# Patient Record
Sex: Male | Born: 1975 | Race: White | Hispanic: No | Marital: Single | Smoking: Never smoker
Health system: Southern US, Community
[De-identification: ages and names within clinical notes are randomized; demographics above are authoritative.]

## PROBLEM LIST (undated history)

## (undated) DIAGNOSIS — Q059 Spina bifida, unspecified: Secondary | ICD-10-CM

## (undated) DIAGNOSIS — K219 Gastro-esophageal reflux disease without esophagitis: Secondary | ICD-10-CM

## (undated) DIAGNOSIS — R569 Unspecified convulsions: Secondary | ICD-10-CM

## (undated) HISTORY — PX: SPINE SURGERY: SHX786

## (undated) HISTORY — PX: SHUNT REVISION: SHX343

## (undated) HISTORY — DX: Gastro-esophageal reflux disease without esophagitis: K21.9

## (undated) HISTORY — PX: FRACTURE SURGERY: SHX138

## (undated) HISTORY — PX: SHUNT REPLACEMENT: SHX5403

---

## 2013-06-19 DIAGNOSIS — G40909 Epilepsy, unspecified, not intractable, without status epilepticus: Secondary | ICD-10-CM | POA: Insufficient documentation

## 2013-08-04 DIAGNOSIS — N319 Neuromuscular dysfunction of bladder, unspecified: Secondary | ICD-10-CM | POA: Insufficient documentation

## 2013-08-04 DIAGNOSIS — Q059 Spina bifida, unspecified: Secondary | ICD-10-CM | POA: Insufficient documentation

## 2020-11-27 ENCOUNTER — Emergency Department (HOSPITAL_COMMUNITY): Payer: Medicare Other

## 2020-11-27 ENCOUNTER — Emergency Department (HOSPITAL_COMMUNITY)
Admission: EM | Admit: 2020-11-27 | Discharge: 2020-11-27 | Disposition: A | Discharge status: Home / Self Care | Payer: Medicare Other | Attending: Emergency Medicine | Admitting: Emergency Medicine

## 2020-11-27 ENCOUNTER — Other Ambulatory Visit: Payer: Self-pay

## 2020-11-27 ENCOUNTER — Encounter (HOSPITAL_COMMUNITY): Payer: Self-pay | Admitting: *Deleted

## 2020-11-27 DIAGNOSIS — K319 Disease of stomach and duodenum, unspecified: Secondary | ICD-10-CM | POA: Insufficient documentation

## 2020-11-27 DIAGNOSIS — G9009 Other idiopathic peripheral autonomic neuropathy: Secondary | ICD-10-CM | POA: Diagnosis present

## 2020-11-27 DIAGNOSIS — Z91040 Latex allergy status: Secondary | ICD-10-CM | POA: Insufficient documentation

## 2020-11-27 DIAGNOSIS — R000 Tachycardia, unspecified: Secondary | ICD-10-CM | POA: Insufficient documentation

## 2020-11-27 DIAGNOSIS — K3189 Other diseases of stomach and duodenum: Secondary | ICD-10-CM

## 2020-11-27 DIAGNOSIS — G609 Hereditary and idiopathic neuropathy, unspecified: Secondary | ICD-10-CM

## 2020-11-27 HISTORY — DX: Spina bifida, unspecified: Q05.9

## 2020-11-27 LAB — CBC WITH DIFFERENTIAL/PLATELET
Abs Immature Granulocytes: 0.02 10*3/uL (ref 0.00–0.07)
Basophils Absolute: 0 10*3/uL (ref 0.0–0.1)
Basophils Relative: 0 %
Eosinophils Absolute: 0.4 10*3/uL (ref 0.0–0.5)
Eosinophils Relative: 6 %
HCT: 44.6 % (ref 39.0–52.0)
Hemoglobin: 15 g/dL (ref 13.0–17.0)
Immature Granulocytes: 0 %
Lymphocytes Relative: 20 %
Lymphs Abs: 1.5 10*3/uL (ref 0.7–4.0)
MCH: 30.8 pg (ref 26.0–34.0)
MCHC: 33.6 g/dL (ref 30.0–36.0)
MCV: 91.6 fL (ref 80.0–100.0)
Monocytes Absolute: 0.5 10*3/uL (ref 0.1–1.0)
Monocytes Relative: 6 %
Neutro Abs: 4.9 10*3/uL (ref 1.7–7.7)
Neutrophils Relative %: 68 %
Platelets: 258 10*3/uL (ref 150–400)
RBC: 4.87 MIL/uL (ref 4.22–5.81)
RDW: 12.8 % (ref 11.5–15.5)
WBC: 7.4 10*3/uL (ref 4.0–10.5)
nRBC: 0 % (ref 0.0–0.2)

## 2020-11-27 LAB — BASIC METABOLIC PANEL
Anion gap: 8 (ref 5–15)
BUN: 13 mg/dL (ref 6–20)
CO2: 28 mmol/L (ref 22–32)
Calcium: 9.3 mg/dL (ref 8.9–10.3)
Chloride: 102 mmol/L (ref 98–111)
Creatinine, Ser: 0.78 mg/dL (ref 0.61–1.24)
GFR, Estimated: >60 mL/min (ref >60)
Glucose, Bld: 104 mg/dL — ABNORMAL HIGH (ref 70–99)
Potassium: 3.4 mmol/L — ABNORMAL LOW (ref 3.5–5.1)
Sodium: 138 mmol/L (ref 135–145)

## 2020-11-27 MED ORDER — IOHEXOL 350 MG/ML SOLN
80.0000 mL | Freq: Once | INTRAVENOUS | Status: AC | PRN
  Administered 2020-11-27: 80 mL via INTRAVENOUS

## 2020-11-27 NOTE — ED Provider Notes (Addendum)
[facility] EMERGENCY DEPARTMENT Provider Note   CSN: [id number] Arrival date & time: 11/27/20  1711     History Chief Complaint  Patient presents with  . Circulatory Problem    Darryl Moran is a 45 y.o. male with a history significant for spina bifida, requiring leg braces and crutch use, although pt and father concur he is predominantly wheelchair bound, presenting for evaluation of a cool left foot with color changes in several toes of this foot.  He also woke this am with an intact blister on the great to of his right foot.  He woke with both of these symptoms today, was seen by his pcp and sent here for concern of difficulty obtaining pulses in the left foot.  Pt reports having sensation at his left arch and left dorsal lateral foot at baseline but chronically otherwise has no sensation in his feet, therefore denies any pain currently.  Denies any injuries to the feet including hot water injury. No tx prior to arrival.  No history of vascular disease, HTN or cholesterol issues. He does not smoke.    The history is provided by the patient and a parent (father helps with history, at bedside).       Past Medical History:  Diagnosis Date  . GERD (gastroesophageal reflux disease)   . GERD (gastroesophageal reflux disease)   . Spina bifida ([place])            No family history on file.  Social History   Tobacco Use  . Smoking status: Never Smoker  . Smokeless tobacco: Never Used  Substance Use Topics  . Alcohol use: Not Currently  . Drug use: Not Currently    Home Medications Prior to Admission medications   Medication Sig Start Date End Date Taking? Authorizing Provider  Ascorbic Acid (VITAMIN C CR) 500 MG TBCR Take by mouth. Patient not taking: No sig reported 05/19/11   [provider]  Calcium Carbonate-Vitamin D 600-200 MG-UNIT TABS Take 1 tablet by mouth at bedtime. 05/19/11   [provider]  Cholecalciferol (VITAMIN D3) 250 MCG (10000 UT)  capsule Take 10,000 Units by mouth at bedtime.    [provider]  imipramine (TOFRANIL) 50 MG tablet Take 50 mg by mouth at bedtime. 10/01/20   [provider]  levETIRAcetam (KEPPRA) 500 MG tablet Take 250-500 mg by mouth See admin instructions. Take 20 mg by mouth in the morning and 500 at night 10/10/20   [provider]  magnesium 30 MG tablet Take 30 mg by mouth daily. Patient not taking: Reported on 11/28/2020    [provider]  Multiple Vitamin (DAILY VITAMINS) tablet Take 1 tablet by mouth at bedtime. 04/06/03   [provider]  omeprazole (PRILOSEC) 20 MG capsule Take 20 mg by mouth in the morning. 09/12/20   [provider]  oxybutynin (DITROPAN XL) 15 MG 24 hr tablet Take 15 mg by mouth in the morning. 09/18/20   [provider]  oxybutynin (DITROPAN) 5 MG tablet Take 5 mg by mouth at bedtime. 11/17/20   [provider]  Probiotic Product (PROBIOTIC DAILY PO) Take by mouth daily. Patient not taking: No sig reported    [provider]  Saccharomyces boulardii (PROBIOTIC) 250 MG CAPS Take 250 mg by mouth daily.    [provider]  sertraline (ZOLOFT) 100 MG tablet Take 100 mg by mouth at bedtime. 11/23/20   [provider]  vitamin C (ASCORBIC ACID) 500 MG tablet Take  500 mg by mouth daily.    [provider]  VITAMIN D PO Take 250 mg by mouth daily. Patient not taking: No sig reported    [provider]    Allergies    Latex and Amoxicillin  Review of Systems   Review of Systems  Constitutional: Negative for chills and fever.  HENT: Negative for sore throat.   Eyes: Negative.   Respiratory: Negative for chest tightness and shortness of breath.   Cardiovascular: Negative for chest pain.  Gastrointestinal: Negative for abdominal pain, nausea and vomiting.  Genitourinary: Negative.   Musculoskeletal: Negative for arthralgias, joint swelling and neck pain.  Skin:  Positive for color change. Negative for rash.  Neurological: Positive for numbness. Negative for dizziness, weakness, light-headedness and headaches.       Baseline lower extremity numbness and weakness per hpi.  Psychiatric/Behavioral: Negative.   All other systems reviewed and are negative.   Physical Exam Updated Vital Signs BP (!) 122/92 (BP Location: Right Arm)   Pulse (!) 109   Temp 98.1 F (36.7 C) (Oral)   Resp 17   SpO2 100%   Physical Exam Vitals and nursing note reviewed.  Constitutional:      Appearance: He is well-developed and well-nourished.  HENT:     Head: Normocephalic and atraumatic.  Eyes:     Conjunctiva/sclera: Conjunctivae normal.  Cardiovascular:     Rate and Rhythm: Regular rhythm. Tachycardia present.     Pulses: Intact distal pulses.          Dorsalis pedis pulses are 2+ on the right side and detected w/ Doppler on the left side.       Posterior tibial pulses are 2+ on the right side and detected w/ Doppler on the left side.     Heart sounds: Normal heart sounds.     Comments: 6 sec cap refill left toes, 3 sec right. Left foot cool to touch, right warm.  Pulmonary:     Effort: Pulmonary effort is normal.     Breath sounds: Normal breath sounds. No wheezing.  Abdominal:     General: There is no distension.     Palpations: Abdomen is soft.     Tenderness: There is no abdominal tenderness.  Musculoskeletal:        General: Normal range of motion.     Cervical back: Normal range of motion.     Right lower leg: No edema.     Left lower leg: No edema.  Skin:    General: Skin is warm and dry.     Comments: Intact blister right distal great toe. Dusky 2nd and third distal toes, left.  Coloration near normalizes with elevation. No edema of feet or lower legs.  Muscle atrophy noted bilateral calves and thighs.   Neurological:     Mental Status: He is alert.  Psychiatric:        Mood and Affect: Mood and affect normal.        ED Results /  Procedures / Treatments   Labs (all labs ordered are listed, but only abnormal results are displayed) Labs Reviewed  BASIC METABOLIC PANEL - Abnormal; Notable for the following components:      Result Value   Potassium 3.4 (*)    Glucose, Bld 104 (*)    All other components within normal limits  CBC WITH DIFFERENTIAL/PLATELET    EKG None  Radiology CT Angio Aortobifemoral W and/or Wo Contrast  Result Date: 11/27/2020 CLINICAL DATA:  Claudication.  Purple feet. Lack of circulation. No pulses in the feet. EXAM: CT ANGIOGRAPHY OF ABDOMINAL AORTA WITH ILIOFEMORAL RUNOFF TECHNIQUE: Multidetector CT imaging of the abdomen, pelvis and lower extremities was performed using the standard protocol during bolus administration of intravenous contrast. Multiplanar CT image reconstructions and MIPs were obtained to evaluate the vascular anatomy. CONTRAST:  91mL OMNIPAQUE IOHEXOL 350 MG/ML SOLN COMPARISON:  80 cc of Omnipaque 350. FINDINGS: VASCULAR Aorta: Normal caliber aorta without aneurysm, dissection, vasculitis or significant stenosis. Celiac: Patent without evidence of aneurysm, dissection, vasculitis or significant stenosis. SMA: Patent without evidence of aneurysm, dissection, vasculitis or significant stenosis. Renals: Both renal arteries are patent without evidence of aneurysm, dissection, vasculitis, fibromuscular dysplasia or significant stenosis. IMA: Patent without evidence of aneurysm, dissection, vasculitis or significant stenosis. RIGHT Lower Extremity Inflow: Common, internal and external iliac arteries are patent without evidence of aneurysm, dissection, vasculitis or significant stenosis. Outflow: Common, superficial and profunda femoral arteries and the popliteal artery are patent without evidence of aneurysm, dissection, vasculitis or significant stenosis. Runoff: Patent three vessel runoff to the ankle. LEFT Lower Extremity Inflow: Common, internal and external iliac arteries are patent  without evidence of aneurysm, dissection, vasculitis or significant stenosis. Outflow: Common, superficial and profunda femoral arteries and the popliteal artery are patent without evidence of aneurysm, dissection, vasculitis or significant stenosis. Runoff: Patent three vessel runoff to the ankle. Veins: No obvious venous abnormality within the limitations of this arterial phase study. Review of the MIP images confirms the above findings. NON-VASCULAR Lower chest: The lung bases are clear. The heart size is normal. Hepatobiliary: The liver is normal. Cholelithiasis without acute inflammation.There is no biliary ductal dilation. Pancreas: Normal contours without ductal dilatation. No peripancreatic fluid collection. Spleen: Unremarkable. Adrenals/Urinary Tract: --Adrenal glands: Unremarkable. --Right kidney/ureter: No hydronephrosis or radiopaque kidney stones. --Left kidney/ureter: No hydronephrosis or radiopaque kidney stones. --Urinary bladder: The urinary bladder is distended. There is some mild bladder wall thickening. Stomach/Bowel: --Stomach/Duodenum: There is an exophytic masslike area arising from the greater curvature of the gastric body measuring approximately 2 6 cm axial series 4, image 40). --Small bowel: Unremarkable. --Colon: Unremarkable. --Appendix: Normal. Vascular/Lymphatic: Normal course and caliber of the major abdominal vessels. --No retroperitoneal lymphadenopathy. --No mesenteric lymphadenopathy. --No pelvic or inguinal lymphadenopathy. Reproductive: Unremarkable Other: No ascites or free air. The abdominal wall is normal. Musculoskeletal. Patient is status post prior plate screw fixation of the proximal left femur. There is a VP shunt noted coursing through the right abdominal wall with the tip terminating at the right hepatic dome. There is lower extremity muscular atrophy, especially below the knees. There are shallow appearing acetabula bilaterally, likely IMPRESSION: 1. No acute  arterial abnormality detected. However, evaluation below the knees was somewhat limited by contrast timing. 2. Cholelithiasis without acute inflammation. 3. Distended urinary bladder with some mild bladder wall thickening. Correlate with urinalysis. Findings are suggestive of a neurogenic bladder. 4. Exophytic masslike area arising from the greater curvature of the gastric body. This is concerning for a malignancy such as a GIST. Outpatient GI follow-up is recommended. Electronically Signed   By: Constance Holster M.D.   On: 11/27/2020 20:59    Procedures Procedures   Medications Ordered in ED Medications  iohexol (OMNIPAQUE) 350 MG/ML injection 80 mL (80 mLs Intravenous Contrast Given 11/27/20 2045)    ED Course  I have reviewed the triage vital signs and the nursing notes.  Pertinent labs & imaging results that were available during my care of the patient were reviewed by  me and considered in my medical decision making (see chart for details).    MDM Rules/Calculators/A&P                          Call placed to Dr. Donnetta Hutching with vascular after exam,  Recommends CT imaging with runoff study prior to further discussion.  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  After further discussion with Dr. Donnetta Hutching pt does not need f/u with vascular.    Possibly neurogenic source of sx.  Pt and father advised elevation, may try warm compresses ( cautioned re mild setting and for not more than 15 minutes to avoid skin injury).  Close f/u with pcp for further eval/management.    Also call placed to Dr. Abbey Chatters with finding of possible gastric tumor.  He will see pt in close office f/u, office will call pt in am to arrange.  Question answered and pt and father agree with plan.  Pt does have hx of gerd, possibly old h/o ulcer, sx currently ok tx with PPI. Final Clinical Impression(s) / ED Diagnoses Final diagnoses:  Idiopathic peripheral  neuropathy  Gastric mass    Rx / DC Orders ED Discharge Orders    None       Landis Martins 11/28/20 1331    Evalee Jefferson, PA-C 11/28/20 1331    Milton Ferguson, MD 11/28/20 1459

## 2020-11-27 NOTE — ED Notes (Signed)
Patient transported to CT 

## 2020-11-27 NOTE — ED Triage Notes (Signed)
States he was referred her by family doctor due to lack of circulation in feet, states the doctor could not find a pulse in feet

## 2020-11-27 NOTE — Discharge Instructions (Addendum)
Your CT scan is negative for any significant vascular findings (no blockage in your arteries of your abdomen or legs).  As discussed,  there is a thickened, mass like finding in your stomach of unclear significance but will need further testing with endoscopy as discussed.  Dr. Abbey Chatters will call you tomorrow to arrange an office visit to further evaluate this finding.

## 2020-11-28 ENCOUNTER — Encounter: Payer: Self-pay | Admitting: Internal Medicine

## 2020-11-28 ENCOUNTER — Ambulatory Visit (INDEPENDENT_AMBULATORY_CARE_PROVIDER_SITE_OTHER): Payer: Medicare Other | Admitting: Internal Medicine

## 2020-11-28 DIAGNOSIS — K219 Gastro-esophageal reflux disease without esophagitis: Secondary | ICD-10-CM

## 2020-11-28 DIAGNOSIS — R935 Abnormal findings on diagnostic imaging of other abdominal regions, including retroperitoneum: Secondary | ICD-10-CM | POA: Diagnosis not present

## 2020-11-28 NOTE — Patient Instructions (Signed)
62    Your procedure is scheduled on: 12/04/2020  Report to [facility] at  7:00   AM.  Call this number if you have problems the morning of surgery: [phone]   Remember:   Follow instructions on letter from office regarding when to stop eating and drinking        No Smoking the day of procedure      Take these medicines the morning of surgery with A SIP OF WATER: Keppra, zoloft, and omeprazole   Do not wear jewelry, make-up or nail polish.  Do not wear lotions, powders, or perfumes. You may wear deodorant.                Do not bring valuables to the hospital.  Contacts, dentures or bridgework may not be worn into surgery.  Leave suitcase in the car. After surgery it may be brought to your room.  For patients admitted to the hospital, checkout time is 11:00 AM the day of discharge.   Patients discharged the day of surgery will not be allowed to drive home. Upper Endoscopy, Adult Upper endoscopy is a procedure to look inside the upper GI (gastrointestinal) tract. The upper GI tract is made up of:  The part of the body that moves food from your mouth to your stomach (esophagus).  The stomach.  The first part of your small intestine (duodenum). This procedure is also called esophagogastroduodenoscopy (EGD) or gastroscopy. In this procedure, your health care provider passes a thin, flexible tube (endoscope) through your mouth and down your esophagus into your stomach. A small camera is attached to the end of the tube. Images from the camera appear on a monitor in the exam room. During this procedure, your health care provider may also remove a small piece of tissue to be sent to a lab and examined under a microscope (biopsy). Your health care provider may do an upper endoscopy to diagnose cancers of the upper GI tract. You may also have this procedure to find the cause of other conditions, such as:  Stomach pain.  Heartburn.  Pain or problems when swallowing.  Nausea and  vomiting.  Stomach bleeding.  Stomach ulcers. Tell a health care provider about:  Any allergies you have.  All medicines you are taking, including vitamins, herbs, eye drops, creams, and over-the-counter medicines.  Any problems you or family members have had with anesthetic medicines.  Any blood disorders you have.  Any surgeries you have had.  Any medical conditions you have.  Whether you are pregnant or may be pregnant. What are the risks? Generally, this is a safe procedure. However, problems may occur, including:  Infection.  Bleeding.  Allergic reactions to medicines.  A tear or hole (perforation) in the esophagus, stomach, or duodenum. What happens before the procedure? Staying hydrated Follow instructions from your health care provider about hydration, which may include:  Up to 2 hours before the procedure - you may continue to drink clear liquids, such as water, clear fruit juice, black coffee, and plain tea.  Eating and drinking restrictions Follow instructions from your health care provider about eating and drinking, which may include:  8 hours before the procedure - stop eating heavy meals or foods, such as meat, fried foods, or fatty foods.  6 hours before the procedure - stop eating light meals or foods, such as toast or cereal.  6 hours before the procedure - stop drinking milk or drinks that contain milk.  2 hours before the  procedure - stop drinking clear liquids. Medicines Ask your health care provider about:  Changing or stopping your regular medicines. This is especially important if you are taking diabetes medicines or blood thinners.  Taking medicines such as aspirin and ibuprofen. These medicines can thin your blood. Do not take these medicines unless your health care provider tells you to take them.  Taking over-the-counter medicines, vitamins, herbs, and supplements. General instructions  Plan to have someone take you home from the  hospital or clinic.  If you will be going home right after the procedure, plan to have someone with you for 24 hours.  Ask your health care provider what steps will be taken to help prevent infection. What happens during the procedure?  1. An IV will be inserted into one of your veins. 2. You may be given one or more of the following: ? A medicine to help you relax (sedative). ? A medicine to numb the throat (local anesthetic). 3. You will lie on your left side on an exam table. 4. Your health care provider will pass the endoscope through your mouth and down your esophagus. 5. Your health care provider will use the scope to check the inside of your esophagus, stomach, and duodenum. Biopsies may be taken. 6. The endoscope will be removed. The procedure may vary among health care providers and hospitals. What happens after the procedure?  Your blood pressure, heart rate, breathing rate, and blood oxygen level will be monitored until you leave the hospital or clinic.  Do not drive for 24 hours if you were given a sedative during your procedure.  When your throat is no longer numb, you may be given some fluids to drink.  It is up to you to get the results of your procedure. Ask your health care provider, or the department that is doing the procedure, when your results will be ready. Summary  Upper endoscopy is a procedure to look inside the upper GI tract.  During the procedure, an IV will be inserted into one of your veins. You may be given a medicine to help you relax.  A medicine will be used to numb your throat.  The endoscope will be passed through your mouth and down your esophagus. This information is not intended to replace advice given to you by your health care provider. Make sure you discuss any questions you have with your health care provider. Document Revised: 03/24/2018 Document Reviewed: 03/01/2018 Elsevier Patient Education  [place] After  Please read the instructions outlined below and refer to this sheet in the next few weeks. These discharge instructions provide you with general information on caring for yourself after you leave the hospital. Your doctor may  also give you specific instructions. While your treatment has been planned according to the most current medical practices available, unavoidable complications occasionally occur. If you have any problems or questions after discharge, please call your doctor. HOME CARE INSTRUCTIONS Activity  You may resume your regular activity but move at a slower pace for the next 24 hours.   Take frequent rest periods for the next 24 hours.   Walking will help expel (get rid of) the air and reduce the bloated feeling in your abdomen.   No driving for 24 hours (because of the anesthesia (medicine) used during the test).   You may shower.   Do not sign any important legal documents or operate any machinery for 24 hours (because of the anesthesia used during the test).  Nutrition  Drink plenty of fluids.   You may resume your normal diet.   Begin with a light meal and progress to your normal diet.   Avoid alcoholic beverages for 24 hours or as instructed by your caregiver.  Medications You may resume your normal medications unless your caregiver tells you otherwise. What you can expect today  You may experience abdominal discomfort such as a feeling of fullness or "gas" pains.   You may experience a sore throat for 2 to 3 days. This is normal. Gargling with salt water may help this.  Follow-up Your doctor will discuss the results of your test with you. SEEK IMMEDIATE MEDICAL CARE IF:  You have excessive nausea (feeling sick to your stomach) and/or vomiting.   You have severe abdominal pain and distention (swelling).   You have trouble  swallowing.   You have a temperature over 100 F (37.8 C).   You have rectal bleeding or vomiting of blood.  Document Released: 05/13/2004 Document Revised: 09/18/2011 Document Reviewed: 11/24/2007

## 2020-11-28 NOTE — Patient Instructions (Signed)
We will schedule you for urgent upper endoscopy to further evaluate abnormal CT findings.  Depending on what I find will determine the next steps.  I will likely perform biopsies as well.  Continue on PPI for chronic reflux which appears to be well controlled.  Further recommendations to follow.  At [facility] we value your feedback. You may receive a survey about your visit today. Please share your experience as we strive to create trusting relationships with our patients to provide genuine, compassionate, quality care.  We appreciate your understanding and patience as we review any laboratory studies, imaging, and other diagnostic tests that are ordered as we care for you. Our office policy is 5 business days for review of these results, and any emergent or urgent results are addressed in a timely manner for your best interest. If you do not hear from our office in 1 week, please contact us.   We also encourage the use of MyChart, which contains your medical information for your review as well. If you are not enrolled in this feature, an access code is on this after visit summary for your convenience. Thank you for allowing [place] to be involved in your care.  It was great to see you today!  I hope you have a great rest of your winter!!    Elon Alas. Abbey Chatters, D.O. Gastroenterology and Hepatology [facility]

## 2020-11-28 NOTE — H&P (View-Only) (Signed)
Primary Care Physician:  Ludwig Clarks, FNP Primary Gastroenterologist:  Dr. Abbey Chatters  Chief Complaint  Patient presents with  . abnormal ct    HPI:   Darryl Moran is a 45 y.o. male who presents to the clinic today by referral from emergency room for evaluation.  He has a history of spina bifida requiring leg braces and crutch use though is primarily wheelchair-bound.  Patient presented to [facility] ER yesterday for concern for vascular issues with color changes and several toes of his left foot.  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  I was called by the emergency provider regarding this and decided to set him up for urgent outpatient follow-up.  Patient does have chronic reflux which is well controlled on omeprazole.  Denies any dysphagia odynophagia.  Parents note that he has had GI issues his whole life but are well controlled on bowel regimen.  Patient states he has had some intermittent left upper quadrant pain otherwise no other complaints.  No weight loss.  No family history of colorectal malignancy but does note his grandmother had a's "small bowel cancer."  Patient's father is unclear what type of cancer this was.  Past Medical History:  Diagnosis Date  . GERD (gastroesophageal reflux disease)   . GERD (gastroesophageal reflux disease)   . Spina bifida [facility])     Past Surgical History:  Procedure Laterality Date  . SPINE SURGERY      Current Outpatient Medications  Medication Sig Dispense Refill  . Ascorbic Acid (VITAMIN C CR) 500 MG TBCR Take by mouth. (Patient not taking: No sig reported)    . Calcium Carbonate-Vitamin D 600-200 MG-UNIT TABS Take 1 tablet by mouth at bedtime.    Marland Kitchen imipramine (TOFRANIL) 50 MG tablet Take 50 mg by mouth at bedtime.    . levETIRAcetam (KEPPRA) 500 MG tablet Take 250-500 mg by mouth See admin instructions. Take 20 mg by mouth in the morning and 500 at  night    . magnesium 30 MG tablet Take 30 mg by mouth daily. (Patient not taking: Reported on 11/28/2020)    . Multiple Vitamin (DAILY VITAMINS) tablet Take 1 tablet by mouth at bedtime.    Marland Kitchen omeprazole (PRILOSEC) 20 MG capsule Take 20 mg by mouth in the morning.    Marland Kitchen oxybutynin (DITROPAN XL) 15 MG 24 hr tablet Take 15 mg by mouth in the morning.    . Probiotic Product (PROBIOTIC DAILY PO) Take by mouth daily. (Patient not taking: No sig reported)    . sertraline (ZOLOFT) 100 MG tablet Take 100 mg by mouth at bedtime.    Marland Kitchen VITAMIN D PO Take 250 mg by mouth daily. (Patient not taking: No sig reported)    . Cholecalciferol (VITAMIN D3) 250 MCG (10000 UT) capsule Take 10,000 Units by mouth at bedtime.    Marland Kitchen oxybutynin (DITROPAN) 5 MG tablet Take 5 mg by mouth at bedtime.    . Saccharomyces boulardii (PROBIOTIC) 250 MG CAPS Take 250 mg by mouth daily.    . vitamin C (ASCORBIC ACID) 500 MG tablet Take 500 mg by mouth daily.     No current facility-administered medications for this visit.    Allergies as of 11/28/2020 - Review Complete 11/28/2020  Allergen Reaction Noted  . Latex Anaphylaxis 11/28/2020  . Amoxicillin Rash 11/28/2020    History reviewed. No pertinent family history.  Social History   Socioeconomic  History  . Marital status: Single    Spouse name: Not on file  . Number of children: Not on file  . Years of education: Not on file  . Highest education level: Not on file  Occupational History  . Not on file  Tobacco Use  . Smoking status: Never Smoker  . Smokeless tobacco: Never Used  Substance and Sexual Activity  . Alcohol use: Not Currently  . Drug use: Not Currently  . Sexual activity: Not on file  Other Topics Concern  . Not on file  Social History Narrative  . Not on file   Social Determinants of Health   Financial Resource Strain: Not on file  Food Insecurity: Not on file  Transportation Needs: Not on file  Physical Activity: Not on file  Stress: Not on  file  Social Connections: Not on file  Intimate Partner Violence: Not on file    Subjective: Review of Systems  Constitutional: Negative for chills and fever.  HENT: Negative for congestion and hearing loss.   Eyes: Negative for blurred vision and double vision.  Respiratory: Negative for cough and shortness of breath.   Cardiovascular: Negative for chest pain and palpitations.  Gastrointestinal: Negative for abdominal pain, blood in stool, constipation, diarrhea, heartburn, melena and vomiting.  Genitourinary: Negative for dysuria and urgency.  Musculoskeletal: Negative for joint pain and myalgias.  Skin: Negative for itching and rash.  Neurological: Negative for dizziness and headaches.  Psychiatric/Behavioral: Negative for depression. The patient is not nervous/anxious.        Objective: BP 127/84   Pulse (!) 117   Temp (!) 97.5 F (36.4 C) (Temporal)   Ht 4\' 11"  (1.499 m)   Wt 136 lb (61.7 kg)   BMI 27.47 kg/m  Physical Exam Constitutional:      Appearance: Normal appearance.     Comments: Uses crutches bilaterally  HENT:     Head: Normocephalic and atraumatic.  Eyes:     Extraocular Movements: Extraocular movements intact.     Conjunctiva/sclera: Conjunctivae normal.  Cardiovascular:     Rate and Rhythm: Normal rate and regular rhythm.  Pulmonary:     Effort: Pulmonary effort is normal.     Breath sounds: Normal breath sounds.  Abdominal:     General: Bowel sounds are normal.     Palpations: Abdomen is soft.  Musculoskeletal:        General: Normal range of motion.     Cervical back: Normal range of motion and neck supple.  Skin:    General: Skin is warm.  Neurological:     General: No focal deficit present.     Mental Status: He is alert and oriented to person, place, and time.  Psychiatric:        Mood and Affect: Mood normal.        Behavior: Behavior normal.      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  I showed them CT imaging as well.  We will schedule for urgent upper endoscopy to further evaluate.  I will likely take biopsies of this area as well.  The risks including infection, bleed, or perforation as well as benefits, limitations, alternatives and imponderables have been reviewed with the patient. Potential for esophageal dilation, biopsy, etc. have also been reviewed.  Questions have been answered. All parties agreeable.  Patient may need EUS in the future but I think a  diagnostic EGD is a reasonable place to start.  Reflux well controlled on omeprazole.  We will continue.   11/28/2020 1:37 PM   Disclaimer: This note was dictated with voice recognition software. Similar sounding words can inadvertently be transcribed and may not be corrected upon review.

## 2020-11-28 NOTE — Progress Notes (Signed)
Primary Care Physician:  Ludwig Clarks, FNP Primary Gastroenterologist:  Dr. Abbey Chatters  Chief Complaint  Patient presents with  . abnormal ct    HPI:   Darryl Moran is a 45 y.o. male who presents to the clinic today by referral from emergency room for evaluation.  He has a history of spina bifida requiring leg braces and crutch use though is primarily wheelchair-bound.  Patient presented to [facility] ER yesterday for concern for vascular issues with color changes and several toes of his left foot.  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  I was called by the emergency provider regarding this and decided to set him up for urgent outpatient follow-up.  Patient does have chronic reflux which is well controlled on omeprazole.  Denies any dysphagia odynophagia.  Parents note that he has had GI issues his whole life but are well controlled on bowel regimen.  Patient states he has had some intermittent left upper quadrant pain otherwise no other complaints.  No weight loss.  No family history of colorectal malignancy but does note his grandmother had a's "small bowel cancer."  Patient's father is unclear what type of cancer this was.  Past Medical History:  Diagnosis Date  . GERD (gastroesophageal reflux disease)   . GERD (gastroesophageal reflux disease)   . Spina bifida [facility])     Past Surgical History:  Procedure Laterality Date  . SPINE SURGERY      Current Outpatient Medications  Medication Sig Dispense Refill  . Ascorbic Acid (VITAMIN C CR) 500 MG TBCR Take by mouth. (Patient not taking: No sig reported)    . Calcium Carbonate-Vitamin D 600-200 MG-UNIT TABS Take 1 tablet by mouth at bedtime.    Marland Kitchen imipramine (TOFRANIL) 50 MG tablet Take 50 mg by mouth at bedtime.    . levETIRAcetam (KEPPRA) 500 MG tablet Take 250-500 mg by mouth See admin instructions. Take 20 mg by mouth in the morning and 500 at  night    . magnesium 30 MG tablet Take 30 mg by mouth daily. (Patient not taking: Reported on 11/28/2020)    . Multiple Vitamin (DAILY VITAMINS) tablet Take 1 tablet by mouth at bedtime.    Marland Kitchen omeprazole (PRILOSEC) 20 MG capsule Take 20 mg by mouth in the morning.    Marland Kitchen oxybutynin (DITROPAN XL) 15 MG 24 hr tablet Take 15 mg by mouth in the morning.    . Probiotic Product (PROBIOTIC DAILY PO) Take by mouth daily. (Patient not taking: No sig reported)    . sertraline (ZOLOFT) 100 MG tablet Take 100 mg by mouth at bedtime.    Marland Kitchen VITAMIN D PO Take 250 mg by mouth daily. (Patient not taking: No sig reported)    . Cholecalciferol (VITAMIN D3) 250 MCG (10000 UT) capsule Take 10,000 Units by mouth at bedtime.    Marland Kitchen oxybutynin (DITROPAN) 5 MG tablet Take 5 mg by mouth at bedtime.    . Saccharomyces boulardii (PROBIOTIC) 250 MG CAPS Take 250 mg by mouth daily.    . vitamin C (ASCORBIC ACID) 500 MG tablet Take 500 mg by mouth daily.     No current facility-administered medications for this visit.    Allergies as of 11/28/2020 - Review Complete 11/28/2020  Allergen Reaction Noted  . Latex Anaphylaxis 11/28/2020  . Amoxicillin Rash 11/28/2020    History reviewed. No pertinent family history.  Social History   Socioeconomic  History  . Marital status: Single    Spouse name: Not on file  . Number of children: Not on file  . Years of education: Not on file  . Highest education level: Not on file  Occupational History  . Not on file  Tobacco Use  . Smoking status: Never Smoker  . Smokeless tobacco: Never Used  Substance and Sexual Activity  . Alcohol use: Not Currently  . Drug use: Not Currently  . Sexual activity: Not on file  Other Topics Concern  . Not on file  Social History Narrative  . Not on file   Social Determinants of Health   Financial Resource Strain: Not on file  Food Insecurity: Not on file  Transportation Needs: Not on file  Physical Activity: Not on file  Stress: Not on  file  Social Connections: Not on file  Intimate Partner Violence: Not on file    Subjective: Review of Systems  Constitutional: Negative for chills and fever.  HENT: Negative for congestion and hearing loss.   Eyes: Negative for blurred vision and double vision.  Respiratory: Negative for cough and shortness of breath.   Cardiovascular: Negative for chest pain and palpitations.  Gastrointestinal: Negative for abdominal pain, blood in stool, constipation, diarrhea, heartburn, melena and vomiting.  Genitourinary: Negative for dysuria and urgency.  Musculoskeletal: Negative for joint pain and myalgias.  Skin: Negative for itching and rash.  Neurological: Negative for dizziness and headaches.  Psychiatric/Behavioral: Negative for depression. The patient is not nervous/anxious.        Objective: BP 127/84   Pulse (!) 117   Temp (!) 97.5 F (36.4 C) (Temporal)   Ht 4\' 11"  (1.499 m)   Wt 136 lb (61.7 kg)   BMI 27.47 kg/m  Physical Exam Constitutional:      Appearance: Normal appearance.     Comments: Uses crutches bilaterally  HENT:     Head: Normocephalic and atraumatic.  Eyes:     Extraocular Movements: Extraocular movements intact.     Conjunctiva/sclera: Conjunctivae normal.  Cardiovascular:     Rate and Rhythm: Normal rate and regular rhythm.  Pulmonary:     Effort: Pulmonary effort is normal.     Breath sounds: Normal breath sounds.  Abdominal:     General: Bowel sounds are normal.     Palpations: Abdomen is soft.  Musculoskeletal:        General: Normal range of motion.     Cervical back: Normal range of motion and neck supple.  Skin:    General: Skin is warm.  Neurological:     General: No focal deficit present.     Mental Status: He is alert and oriented to person, place, and time.  Psychiatric:        Mood and Affect: Mood normal.        Behavior: Behavior normal.      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  I showed them CT imaging as well.  We will schedule for urgent upper endoscopy to further evaluate.  I will likely take biopsies of this area as well.  The risks including infection, bleed, or perforation as well as benefits, limitations, alternatives and imponderables have been reviewed with the patient. Potential for esophageal dilation, biopsy, etc. have also been reviewed.  Questions have been answered. All parties agreeable.  Patient may need EUS in the future but I think a  diagnostic EGD is a reasonable place to start.  Reflux well controlled on omeprazole.  We will continue.   11/28/2020 1:37 PM   Disclaimer: This note was dictated with voice recognition software. Similar sounding words can inadvertently be transcribed and may not be corrected upon review.

## 2020-11-29 ENCOUNTER — Telehealth: Payer: Self-pay | Admitting: *Deleted

## 2020-11-29 NOTE — Telephone Encounter (Signed)
-----   Message from [name], DO sent at 11/27/2020  9:58 PM EST ----- The ER just called me on this patient. He needs urgent evaluation in our clinic for abnormal CT findings. Can we call and set him up for office visit with either myself or one of the apps? Thank you

## 2020-11-30 ENCOUNTER — Encounter (HOSPITAL_COMMUNITY)
Admission: RE | Admit: 2020-11-30 | Discharge: 2020-11-30 | Disposition: A | Discharge status: Home / Self Care | Payer: Medicare Other | Source: Ambulatory Visit | Attending: Internal Medicine | Admitting: Internal Medicine

## 2020-11-30 ENCOUNTER — Other Ambulatory Visit: Payer: Self-pay

## 2020-11-30 ENCOUNTER — Other Ambulatory Visit (HOSPITAL_COMMUNITY)
Admission: RE | Admit: 2020-11-30 | Discharge: 2020-11-30 | Disposition: A | Discharge status: Home / Self Care | Payer: Medicare Other | Source: Ambulatory Visit | Attending: Internal Medicine | Admitting: Internal Medicine

## 2020-11-30 ENCOUNTER — Encounter (HOSPITAL_COMMUNITY): Payer: Self-pay

## 2020-11-30 DIAGNOSIS — Z20822 Contact with and (suspected) exposure to covid-19: Secondary | ICD-10-CM | POA: Insufficient documentation

## 2020-11-30 DIAGNOSIS — Z01812 Encounter for preprocedural laboratory examination: Secondary | ICD-10-CM | POA: Diagnosis not present

## 2020-11-30 HISTORY — DX: Unspecified convulsions: R56.9

## 2020-12-01 LAB — SARS CORONAVIRUS 2 (TAT 6-24 HRS): SARS Coronavirus 2: NEGATIVE

## 2020-12-04 ENCOUNTER — Encounter (HOSPITAL_COMMUNITY): Payer: Self-pay

## 2020-12-04 ENCOUNTER — Ambulatory Visit (HOSPITAL_COMMUNITY)
Admission: RE | Admit: 2020-12-04 | Discharge: 2020-12-04 | Disposition: A | Discharge status: Home / Self Care | Payer: Medicare Other | Attending: Internal Medicine | Admitting: Internal Medicine

## 2020-12-04 ENCOUNTER — Encounter (HOSPITAL_COMMUNITY)
Admission: RE | Disposition: A | Discharge status: Home / Self Care | Payer: Self-pay | Source: Home / Self Care | Attending: Internal Medicine

## 2020-12-04 ENCOUNTER — Ambulatory Visit (HOSPITAL_COMMUNITY): Payer: Medicare Other | Admitting: Anesthesiology

## 2020-12-04 DIAGNOSIS — Z79899 Other long term (current) drug therapy: Secondary | ICD-10-CM | POA: Insufficient documentation

## 2020-12-04 DIAGNOSIS — D490 Neoplasm of unspecified behavior of digestive system: Secondary | ICD-10-CM | POA: Diagnosis not present

## 2020-12-04 DIAGNOSIS — Z880 Allergy status to penicillin: Secondary | ICD-10-CM | POA: Diagnosis not present

## 2020-12-04 DIAGNOSIS — Z91040 Latex allergy status: Secondary | ICD-10-CM | POA: Insufficient documentation

## 2020-12-04 DIAGNOSIS — K2950 Unspecified chronic gastritis without bleeding: Secondary | ICD-10-CM | POA: Insufficient documentation

## 2020-12-04 DIAGNOSIS — Z993 Dependence on wheelchair: Secondary | ICD-10-CM | POA: Diagnosis not present

## 2020-12-04 DIAGNOSIS — Q059 Spina bifida, unspecified: Secondary | ICD-10-CM | POA: Insufficient documentation

## 2020-12-04 DIAGNOSIS — K219 Gastro-esophageal reflux disease without esophagitis: Secondary | ICD-10-CM | POA: Insufficient documentation

## 2020-12-04 DIAGNOSIS — R933 Abnormal findings on diagnostic imaging of other parts of digestive tract: Secondary | ICD-10-CM | POA: Diagnosis present

## 2020-12-04 DIAGNOSIS — Z800 Family history of malignant neoplasm of digestive organs: Secondary | ICD-10-CM | POA: Diagnosis not present

## 2020-12-04 DIAGNOSIS — K2970 Gastritis, unspecified, without bleeding: Secondary | ICD-10-CM

## 2020-12-04 HISTORY — PX: BIOPSY: SHX5522

## 2020-12-04 HISTORY — PX: ESOPHAGOGASTRODUODENOSCOPY (EGD) WITH PROPOFOL: SHX5813

## 2020-12-04 SURGERY — ESOPHAGOGASTRODUODENOSCOPY (EGD) WITH PROPOFOL
Anesthesia: General

## 2020-12-04 MED ORDER — LACTATED RINGERS IV SOLN: INTRAVENOUS | Status: DC

## 2020-12-04 MED ORDER — LIDOCAINE HCL (CARDIAC) PF 100 MG/5ML IV SOSY
PREFILLED_SYRINGE | INTRAVENOUS | Status: DC | PRN
  Administered 2020-12-04: 50 mg via INTRAVENOUS

## 2020-12-04 MED ORDER — STERILE WATER FOR IRRIGATION IR SOLN
Status: DC | PRN
  Administered 2020-12-04: 100 mL

## 2020-12-04 MED ORDER — PROPOFOL 10 MG/ML IV BOLUS
INTRAVENOUS | Status: DC | PRN
Start: 2020-12-04 — End: 2020-12-04
  Administered 2020-12-04: 10 mg via INTRAVENOUS
  Administered 2020-12-04: 70 mg via INTRAVENOUS

## 2020-12-04 NOTE — Interval H&P Note (Signed)
History and Physical Interval Note:  12/04/2020 8:15 AM  Darryl Moran  has presented today for surgery, with the diagnosis of abnormal CT.  The various methods of treatment have been discussed with the patient and family. After consideration of risks, benefits and other options for treatment, the patient has consented to  Procedure(s) with comments: ESOPHAGOGASTRODUODENOSCOPY (EGD) WITH PROPOFOL (N/A) - 9:00am as a surgical intervention.  The patient's history has been reviewed, patient examined, no change in status, stable for surgery.  I have reviewed the patient's chart and labs.  Questions were answered to the patient's satisfaction.     [name]

## 2020-12-04 NOTE — Op Note (Signed)
[facility] Patient Name: Darryl Moran Procedure Date: 12/04/2020 8:59 AM MRN: [id number] Date of Birth: 01-Oct-1976 Attending MD: Elon Alas. Abbey Chatters DO CSN: [id number] Age: 45 Admit Type: Outpatient Procedure:                Upper GI endoscopy Indications:              Abnormal CT of the GI tract Providers:                Elon Alas. Abbey Chatters, DO, Lambert Mody, Nelma Rothman, Technician Referring MD:              Medicines:                See the Anesthesia note for documentation of the                            administered medications Complications:            No immediate complications. Estimated Blood Loss:     Estimated blood loss was minimal. Procedure:                Pre-Anesthesia Assessment:                           - The anesthesia plan was to use monitored                            anesthesia care (MAC).                           After obtaining informed consent, the endoscope was                            passed under direct vision. Throughout the                            procedure, the patient's blood pressure, pulse, and                            oxygen saturations were monitored continuously. The                            [id number]) was introduced through the mouth,                            and advanced to the second part of duodenum. The                            upper GI endoscopy was accomplished without                            difficulty. The patient tolerated the procedure                            well. Scope In: 9:19:06 AM  Scope Out: 9:23:20 AM Total Procedure Duration: 0 hours 4 minutes 14 seconds  Findings:      There is no endoscopic evidence of Barrett's esophagus, bleeding,       esophagitis or hiatal hernia in the entire esophagus.      A 34m, submucosal, non-circumferential mass with no bleeding and no       stigmata of recent bleeding was found on the greater curvature of the       stomach.  Biopsies were taken with a cold forceps for histology.      Diffuse moderate inflammation characterized by erosions and erythema was       found in the entire examined stomach. Biopsies were taken with a cold       forceps for Helicobacter pylori testing.      The duodenal bulb, first portion of the duodenum and second portion of       the duodenum were normal. 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 Biopsied.                           - Gastritis. Biopsied.                           - Normal duodenal bulb, first portion of the                            duodenum and second portion of the duodenum. Moderate Sedation:      Per Anesthesia Care Recommendation:           - Patient has a contact number available for                            emergencies. The signs and symptoms of potential                            delayed complications were discussed with the                            patient. Return to normal activities tomorrow.                            Written discharge instructions were provided to the                            patient.                           - Resume previous diet.                           - Continue present medications.                           - Await pathology results.                           -  Further recommendations after pathology reviewed. Procedure Code(s):        --- Professional ---                           [id number], Esophagogastroduodenoscopy, flexible,                            transoral; with biopsy, single or multiple Diagnosis Code(s):        --- Professional ---                           D49.0, Neoplasm of unspecified behavior of                            digestive system                           K29.70, Gastritis, unspecified, without bleeding                           R93.3, Abnormal findings on diagnostic imaging of                             other parts of digestive tract CPT copyright 2019 [employer]. All rights reserved. The codes documented in this report are preliminary and upon coder review may  be revised to meet current compliance requirements. Elon Alas. Abbey Chatters, DO [place] Abbey Chatters, DO 12/04/2020 9:29:20 AM This report has been signed electronically. Number of Addenda: 0

## 2020-12-04 NOTE — Discharge Instructions (Addendum)
EGD Discharge instructions Please read the instructions outlined below and refer to this sheet in the next few weeks. These discharge instructions provide you with general information on caring for yourself after you leave the hospital. Your doctor may also give you specific instructions. While your treatment has been planned according to the most current medical practices available, unavoidable complications occasionally occur. If you have any problems or questions after discharge, please call your doctor. ACTIVITY  You may resume your regular activity but move at a slower pace for the next 24 hours.   Take frequent rest periods for the next 24 hours.   Walking will help expel (get rid of) the air and reduce the bloated feeling in your abdomen.   No driving for 24 hours (because of the anesthesia (medicine) used during the test).   You may shower.   Do not sign any important legal documents or operate any machinery for 24 hours (because of the anesthesia used during the test).  NUTRITION  Drink plenty of fluids.   You may resume your normal diet.   Begin with a light meal and progress to your normal diet.   Avoid alcoholic beverages for 24 hours or as instructed by your caregiver.  MEDICATIONS  You may resume your normal medications unless your caregiver tells you otherwise.  WHAT YOU CAN EXPECT TODAY  You may experience abdominal discomfort such as a feeling of fullness or "gas" pains.  FOLLOW-UP  Your doctor will discuss the results of your test with you.  SEEK IMMEDIATE MEDICAL ATTENTION IF ANY OF THE FOLLOWING OCCUR:  Excessive nausea (feeling sick to your stomach) and/or vomiting.   Severe abdominal pain and distention (swelling).   Trouble swallowing.   Temperature over 101 F (37.8 C).   Rectal bleeding or vomiting of blood.   I found the small tumor in the stomach which was noted on CT imaging.  I biopsied this thoroughly.  Depending on what the pathology shows  will determine the next steps.  Await pathology results, I will contact you later this week.   I hope you have a great rest of your week!  Elon Alas. Abbey Chatters, D.O. Gastroenterology and Hepatology [facility]  PATIENT INSTRUCTIONS POST-ANESTHESIA  IMMEDIATELY FOLLOWING SURGERY:  Do not drive or operate machinery for the first twenty four hours after surgery.  Do not make any important decisions for twenty four hours after surgery or while taking narcotic pain medications or sedatives.  If you develop intractable nausea and vomiting or a severe headache please notify your doctor immediately.  FOLLOW-UP:  Please make an appointment with your surgeon as instructed. You do not need to follow up with anesthesia unless specifically instructed to do so.  WOUND CARE INSTRUCTIONS (if applicable):  Keep a dry clean dressing on the anesthesia/puncture wound site if there is drainage.  Once the wound has quit draining you may leave it open to air.  Generally you should leave the bandage intact for twenty four hours unless there is drainage.  If the epidural site drains for more than 36-48 hours please call the anesthesia department.  QUESTIONS?:  Please feel free to call your physician or the hospital operator if you have any questions, and they will be happy to assist you.

## 2020-12-04 NOTE — Anesthesia Preprocedure Evaluation (Signed)
Anesthesia Evaluation  Patient identified by MRN, date of birth, ID band Patient awake    Reviewed: Allergy & Precautions, H&P , NPO status , Patient's Chart, lab work & pertinent test results, reviewed documented beta blocker date and time   Airway Mallampati: II  TM Distance: >3 FB Neck ROM: full    Dental no notable dental hx.    Pulmonary neg pulmonary ROS,    Pulmonary exam normal breath sounds clear to auscultation       Cardiovascular Exercise Tolerance: Good negative cardio ROS   Rhythm:regular Rate:Normal     Neuro/Psych Seizures -, Well Controlled,   Neuromuscular disease negative psych ROS   GI/Hepatic Neg liver ROS, GERD  Medicated,  Endo/Other  negative endocrine ROS  Renal/GU negative Renal ROS  negative genitourinary   Musculoskeletal   Abdominal   Peds  Hematology negative hematology ROS (+)   Anesthesia Other Findings   Reproductive/Obstetrics negative OB ROS                             Anesthesia Physical Anesthesia Plan  ASA: III  Anesthesia Plan: General   Post-op Pain Management:    Induction:   PONV Risk Score and Plan: Propofol infusion  Airway Management Planned:   Additional Equipment:   Intra-op Plan:   Post-operative Plan:   Informed Consent: I have reviewed the patients History and Physical, chart, labs and discussed the procedure including the risks, benefits and alternatives for the proposed anesthesia with the patient or authorized representative who has indicated his/her understanding and acceptance.     Dental Advisory Given  Plan Discussed with: CRNA  Anesthesia Plan Comments:         Anesthesia Quick Evaluation

## 2020-12-04 NOTE — Anesthesia Postprocedure Evaluation (Signed)
Anesthesia Post Note  Patient: Darryl Moran  Procedure(s) Performed: ESOPHAGOGASTRODUODENOSCOPY (EGD) WITH PROPOFOL (N/A ) BIOPSY  Patient location during evaluation: Phase II Anesthesia Type: General Level of consciousness: awake and alert Pain management: satisfactory to patient Vital Signs Assessment: post-procedure vital signs reviewed and stable Respiratory status: spontaneous breathing and respiratory function stable Cardiovascular status: blood pressure returned to baseline and stable Postop Assessment: no apparent nausea or vomiting Anesthetic complications: no   No complications documented.   Last Vitals:  Vitals:   12/04/20 0745 12/04/20 0930  BP: (!) 128/94 120/82  Pulse: (!) 106 98  Resp: (!) 7 14  Temp: 36.8 C 36.4 C  SpO2: 100% 100%    Last Pain:  Vitals:   12/04/20 0930  TempSrc: Oral  PainSc: 0-No pain                 Karna Dupes

## 2020-12-04 NOTE — Transfer of Care (Signed)
Immediate Anesthesia Transfer of Care Note  Patient: Darryl Moran  Procedure(s) Performed: ESOPHAGOGASTRODUODENOSCOPY (EGD) WITH PROPOFOL (N/A ) BIOPSY  Patient Location: PACU  Anesthesia Type:General  Level of Consciousness: awake and alert   Airway & Oxygen Therapy: Patient Spontanous Breathing  Post-op Assessment: Report given to RN and Post -op Vital signs reviewed and stable  Post vital signs: Reviewed and stable  Last Vitals:  Vitals Value Taken Time  BP 120/82 12/04/20 0930  Temp 36.4 C 12/04/20 0930  Pulse 98 12/04/20 0930  Resp 14 12/04/20 0930  SpO2 100 % 12/04/20 0930    Last Pain:  Vitals:   12/04/20 0930  TempSrc: Oral  PainSc: 0-No pain         Complications: No complications documented.

## 2020-12-05 LAB — SURGICAL PATHOLOGY

## 2020-12-07 ENCOUNTER — Telehealth: Payer: Self-pay

## 2020-12-07 ENCOUNTER — Encounter (HOSPITAL_COMMUNITY): Payer: Self-pay | Admitting: Internal Medicine

## 2020-12-07 ENCOUNTER — Telehealth: Payer: Self-pay | Admitting: Internal Medicine

## 2020-12-07 NOTE — Telephone Encounter (Signed)
[name], DO  Milus Banister, MD; Mansouraty, Telford Nab., MD; Timothy Lasso, RN I spoke with patient's parents who are primary caregivers. Patient was born with spina bifida and although conversational and partially self sufficient, relies on his parents for a lot of his care. They are agreeable to undergo EUS in [facility] and are awaiting your call. Thank you guys for taking care of this patient.   [employer]

## 2020-12-07 NOTE — Telephone Encounter (Signed)
[phone] PATIENT FATHER SAID THAT HE BELIEVED WE CALLED HIM YESTERDAY, HIS CELL DID NOT RING AND IT WOULD BE BETTER TO [place] PHONE

## 2020-12-07 NOTE — Telephone Encounter (Signed)
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 If patient agrees, then  reply back, and Chong Sicilian will get set up with DJ or myself in the coming weeks (we are booked for the coming weeks but will get him in). Thanks. GM ----- Message ----- From: [name], DO Sent: 12/06/2020   1:50 PM EST To: Irving Copas., MD  Darryl Moran,  I performed EGD on this patient due to abnormal CT imaging of suspected gastric mass.  EGD did show 2.5 cm mass in the greater curvature appeared submucosal.  I did take biopsies which were unremarkable.  I believe the next step would be an EUS but wanted to get your thoughts first before I set him up with you or DJ.  Thanks  Jonelle Sports

## 2020-12-07 NOTE — Telephone Encounter (Signed)
Returned call to pt and his father and advised that I had not called this pt. Advised to call that number from caller ID. Pt's father stated it said [facility] but we did not call from here.

## 2020-12-07 NOTE — Telephone Encounter (Signed)
Patents father is returning your call

## 2020-12-10 ENCOUNTER — Other Ambulatory Visit: Payer: Self-pay

## 2020-12-10 DIAGNOSIS — C49A0 Gastrointestinal stromal tumor, unspecified site: Secondary | ICD-10-CM

## 2020-12-10 DIAGNOSIS — R935 Abnormal findings on diagnostic imaging of other abdominal regions, including retroperitoneum: Secondary | ICD-10-CM

## 2020-12-10 NOTE — Telephone Encounter (Signed)
EUS scheduled, pt instructed and medications reviewed.  Patient instructions mailed to home.  Patient to call with any questions or concerns.  

## 2020-12-10 NOTE — Telephone Encounter (Signed)
The pt has been scheduled for EUS on 01/10/21 COVID test on 01/07/21 at 1025 am.

## 2021-01-07 ENCOUNTER — Other Ambulatory Visit (HOSPITAL_COMMUNITY)
Admission: RE | Admit: 2021-01-07 | Discharge: 2021-01-07 | Disposition: A | Discharge status: Home / Self Care | Payer: Medicare Other | Source: Ambulatory Visit | Attending: Gastroenterology | Admitting: Gastroenterology

## 2021-01-07 DIAGNOSIS — Z01812 Encounter for preprocedural laboratory examination: Secondary | ICD-10-CM | POA: Insufficient documentation

## 2021-01-07 DIAGNOSIS — Z20822 Contact with and (suspected) exposure to covid-19: Secondary | ICD-10-CM | POA: Diagnosis not present

## 2021-01-07 LAB — SARS CORONAVIRUS 2 (TAT 6-24 HRS): SARS Coronavirus 2: NEGATIVE

## 2021-01-09 ENCOUNTER — Other Ambulatory Visit: Payer: Self-pay

## 2021-01-09 ENCOUNTER — Encounter (HOSPITAL_COMMUNITY): Payer: Self-pay | Admitting: Gastroenterology

## 2021-01-09 NOTE — Progress Notes (Signed)
PCP - Westley Chandler, FNP Cardiologist -   Chest x-ray -  EKG -  Stress Test -  ECHO -  Cardiac Cath -    COVID TEST- 01/07/21  Anesthesia review: n/a  -------------  SDW INSTRUCTIONS:  Your procedure is scheduled on 01/10/21. Please report to [facility] Main Entrance "A" at 0700 A.M., and check in at the Admitting office. Call this number if you have problems the morning of surgery: [phone]   Remember: Do not eat or drink after midnight the night before your surgery   Medications to take morning of surgery with a sip of water include: levETIRAcetam (KEPPRA) omeprazole (PRILOSEC)   As of today, STOP taking any Aspirin (unless otherwise instructed by your surgeon), Aleve, Naproxen, Ibuprofen, Motrin, Advil, Goody's, BC's, all herbal medications, fish oil, and all vitamins.    The Morning of Surgery Do not wear jewelry Do not wear lotions, powders, colognes, or deodorant Men may shave face and neck. Do not bring valuables to the hospital. [facility] is not responsible for any belongings or valuables. If you are a smoker, DO NOT Smoke 24 hours prior to surgery If you wear a CPAP at night please bring your mask the morning of surgery  Remember that you must have someone to transport you home after your surgery, and remain with you for 24 hours if you are discharged the same day. Please bring cases for contacts, glasses, hearing aids, dentures or bridgework because it cannot be worn into surgery.   Patients discharged the day of surgery will not be allowed to drive home.   Please shower the NIGHT BEFORE SURGERY and the MORNING OF SURGERY with DIAL Soap. Wear comfortable clothes the morning of surgery. Oral Hygiene is also important to reduce your risk of infection.  Remember - BRUSH YOUR TEETH THE MORNING OF SURGERY WITH YOUR REGULAR TOOTHPASTE  Patient denies shortness of breath, fever, cough and chest pain.

## 2021-01-10 ENCOUNTER — Ambulatory Visit (HOSPITAL_COMMUNITY): Payer: Medicare Other | Admitting: Anesthesiology

## 2021-01-10 ENCOUNTER — Ambulatory Visit (HOSPITAL_COMMUNITY)
Admission: RE | Admit: 2021-01-10 | Discharge: 2021-01-10 | Disposition: A | Discharge status: Home / Self Care | Payer: Medicare Other | Attending: Gastroenterology | Admitting: Gastroenterology

## 2021-01-10 ENCOUNTER — Encounter (HOSPITAL_COMMUNITY)
Admission: RE | Disposition: A | Discharge status: Home / Self Care | Payer: Self-pay | Source: Home / Self Care | Attending: Gastroenterology

## 2021-01-10 ENCOUNTER — Encounter (HOSPITAL_COMMUNITY): Payer: Self-pay | Admitting: Gastroenterology

## 2021-01-10 DIAGNOSIS — Z8669 Personal history of other diseases of the nervous system and sense organs: Secondary | ICD-10-CM | POA: Insufficient documentation

## 2021-01-10 DIAGNOSIS — K219 Gastro-esophageal reflux disease without esophagitis: Secondary | ICD-10-CM | POA: Insufficient documentation

## 2021-01-10 DIAGNOSIS — Z91040 Latex allergy status: Secondary | ICD-10-CM | POA: Insufficient documentation

## 2021-01-10 DIAGNOSIS — K2970 Gastritis, unspecified, without bleeding: Secondary | ICD-10-CM | POA: Diagnosis not present

## 2021-01-10 DIAGNOSIS — Q059 Spina bifida, unspecified: Secondary | ICD-10-CM | POA: Diagnosis not present

## 2021-01-10 DIAGNOSIS — C49A0 Gastrointestinal stromal tumor, unspecified site: Secondary | ICD-10-CM

## 2021-01-10 DIAGNOSIS — K3189 Other diseases of stomach and duodenum: Secondary | ICD-10-CM | POA: Diagnosis present

## 2021-01-10 DIAGNOSIS — R935 Abnormal findings on diagnostic imaging of other abdominal regions, including retroperitoneum: Secondary | ICD-10-CM

## 2021-01-10 DIAGNOSIS — Z880 Allergy status to penicillin: Secondary | ICD-10-CM | POA: Insufficient documentation

## 2021-01-10 DIAGNOSIS — R933 Abnormal findings on diagnostic imaging of other parts of digestive tract: Secondary | ICD-10-CM | POA: Diagnosis not present

## 2021-01-10 DIAGNOSIS — D490 Neoplasm of unspecified behavior of digestive system: Secondary | ICD-10-CM | POA: Diagnosis not present

## 2021-01-10 HISTORY — PX: FINE NEEDLE ASPIRATION: SHX5430

## 2021-01-10 HISTORY — PX: EUS: SHX5427

## 2021-01-10 HISTORY — PX: ESOPHAGOGASTRODUODENOSCOPY (EGD) WITH PROPOFOL: SHX5813

## 2021-01-10 HISTORY — PX: BIOPSY: SHX5522

## 2021-01-10 SURGERY — UPPER ENDOSCOPIC ULTRASOUND (EUS) RADIAL
Anesthesia: Monitor Anesthesia Care

## 2021-01-10 MED ORDER — SODIUM CHLORIDE 0.9 % IV SOLN: INTRAVENOUS | Status: DC

## 2021-01-10 MED ORDER — PROPOFOL 500 MG/50ML IV EMUL
INTRAVENOUS | Status: DC | PRN
  Administered 2021-01-10: 150 ug/kg/min via INTRAVENOUS

## 2021-01-10 MED ORDER — OMEPRAZOLE 20 MG PO CPDR: 20.0000 mg | DELAYED_RELEASE_CAPSULE | Freq: Two times a day (BID) | ORAL | 5 refills | Status: DC

## 2021-01-10 MED ORDER — LIDOCAINE 2% (20 MG/ML) 5 ML SYRINGE
INTRAMUSCULAR | Status: DC | PRN
  Administered 2021-01-10: 60 mg via INTRAVENOUS
  Administered 2021-01-10: 40 mg via INTRAVENOUS

## 2021-01-10 MED ORDER — LACTATED RINGERS IV SOLN: INTRAVENOUS | Status: DC

## 2021-01-10 MED ORDER — ONDANSETRON HCL 4 MG/2ML IJ SOLN: 4.0000 mg | Freq: Once | INTRAMUSCULAR | Status: DC | PRN

## 2021-01-10 NOTE — Anesthesia Postprocedure Evaluation (Signed)
Anesthesia Post Note  Patient: Darryl Moran  Procedure(s) Performed: UPPER ENDOSCOPIC ULTRASOUND (EUS) RADIAL (N/A ) BIOPSY FINE NEEDLE ASPIRATION (FNA) LINEAR     Patient location during evaluation: PACU Anesthesia Type: MAC Level of consciousness: awake and alert and oriented Pain management: pain level controlled Vital Signs Assessment: post-procedure vital signs reviewed and stable Respiratory status: spontaneous breathing, nonlabored ventilation and respiratory function stable Cardiovascular status: stable and blood pressure returned to baseline Postop Assessment: no apparent nausea or vomiting Anesthetic complications: no   No complications documented.  Last Vitals:  Vitals:   01/10/21 0930 01/10/21 0945  BP: (!) 128/92 (!) 132/91  Pulse: 91 87  Resp: (!) 8 19  Temp: 36.6 C   SpO2: 100% 100%    Last Pain:  Vitals:   01/10/21 0945  TempSrc:   PainSc: 4                  Shunte Senseney A.

## 2021-01-10 NOTE — Op Note (Signed)
[facility] Patient Name: Darryl Moran Procedure Date : 01/10/2021 MRN: [id number] Attending MD: Justice Britain , MD Date of Birth: 10/19/1975 CSN: [id number] Age: 45 Admit Type: Outpatient Procedure:                Upper EUS Indications:              Gastric mucosal mass/polyp found on endoscopy,                            Suspected mass in stomach on CT scan, Submucosal                            tumor versus extrinsic mass found on endoscopy Providers:                Justice Britain, MD, Kary Kos RN, RN, Cletis Athens, Technician Referring MD:             Elon Alas. Abbey Chatters, DO, Westley Chandler Medicines:                Monitored Anesthesia Care Complications:            No immediate complications. Estimated Blood Loss:     Estimated blood loss was minimal. Procedure:                Pre-Anesthesia Assessment:                           - Prior to the procedure, a History and Physical                            was performed, and patient medications and                            allergies were reviewed. The patient's tolerance of                            previous anesthesia was also reviewed. The risks                            and benefits of the procedure and the sedation                            options and risks were discussed with the patient.                            All questions were answered, and informed consent                            was obtained. Prior Anticoagulants: The patient has                            taken no previous anticoagulant or antiplatelet  agents. ASA Grade Assessment: III - A patient with                            severe systemic disease. After reviewing the risks                            and benefits, the patient was deemed in                            satisfactory condition to undergo the procedure.                           After obtaining informed consent, the  endoscope was                            passed under direct vision. Throughout the                            procedure, the patient's blood pressure, pulse, and                            oxygen saturations were monitored continuously. The                            GIF-H190 ([id number]) Olympus gastroscope was                            introduced through the mouth, and advanced to the                            second part of duodenum. The upper EUS was                            accomplished without difficulty. The patient                            tolerated the procedure. The GF-UE160-AL5 ([id number])                            Olympus Radial EUS scope was introduced through the                            mouth, and advanced to the stomach for ultrasound                            examination. The GF-[id number] ([id number]) Olympus Linear                            EUS scope was introduced through the mouth, and                            advanced to the stomach for ultrasound examination. Scope In: Scope Out: Findings:      ENDOSCOPIC FINDING: :  No gross lesions were noted in the entire esophagus.      The Z-line was regular and was found 36 cm from the incisors.      Patchy moderate inflammation characterized by erosions, erythema,       friability and granularity was found in the gastric body and in the       gastric antrum. Biopsies were taken with a cold forceps for histology       and Helicobacter pylori testing.      A medium-sized, submucosal, non-circumferential mass with no bleeding       and no stigmata of recent bleeding was found on the greater curvature of       the stomach. There appeared to be a difference in the way this looked       compared to prior endoscopy with a nodularity to the upper portion       (suspect where biopsies had been obtained previously).      No gross lesions were noted in the duodenal bulb, in the first portion       of the duodenum and in the  second portion of the duodenum.      ENDOSONOGRAPHIC FINDING: :      A round intramural (subepithelial) lesion was found in the greater curve       of the stomach. It was encountered at 4 cm distal to the       gastroesophageal junction. The lesion was hypoechoic. Sonographically,       the lesion appeared to originate from the muscularis propria (Layer 4).       The lesion measured 34 mm (in maximum thickness). The lesion also       measured 26 mm in diameter. The outer endosonographic borders were well       defined. There were multiple arterial tributaries that were supplying       blood flow to the lesion. Fine needle biopsy was performed. Color       Doppler imaging was utilized prior to needle puncture to confirm a lack       of significant vascular structures within the needle path. Six passes       were made with the Acquire 22 gauge ultrasound core biopsy needle using       a transgastric approach. Visible cores of tissue were obtained.       Preliminary cytologic examination and touch preps were performed. Final       cytology results are pending.      No malignant-appearing lymph nodes were visualized in the celiac region       (level 20), perigastric region and peripancreatic region.      Endosonographic imaging in the visualized portion of the liver showed no       mass.      The celiac region was visualized. Impression:               EGD Impression:                           - No gross lesions in esophagus. Z-line regular, 36                            cm from the incisors.                           -  Gastritis. Biopsied.                           - Rule out malignancy, gastric subepithelial lesion                            on the greater curvature of the stomach was noted.                           - No gross lesions in the duodenal bulb, in the                            first portion of the duodenum and in the second                            portion of the duodenum.                            EUS Impression:                           - An intramural (subepithelial) lesion was found in                            the greater curve of the stomach. The lesion                            appeared to originate from within the muscularis                            propria (Layer 4). Cytology results are pending.                            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 Fine needle biopsy performed.                           - No malignant-appearing lymph nodes were                            visualized in the celiac region (level 20),                            perigastric region and peripancreatic region. Recommendation:           - The patient will be observed post-procedure,                            until all discharge criteria are met.                           -  Discharge patient to home.                           - Patient has a contact number available for                            emergencies. The signs and symptoms of potential                            delayed complications were discussed with the                            patient. Return to normal activities tomorrow.                            Written discharge instructions were provided to the                            patient.                           - Resume previous diet.                           - Increase Omeprazole to 20 mg twice daily.                           - Observe patient's clinical course.                           - Await cytology results and await path results.                           - Based on results if this turns out to be a GIST,                            then patient will need a CT-Chest to complete                            staging. As well, due to the size, the patient                            would need to be sent to Surgical  Oncology and                            Oncology for further evaluation/treatment.                           - The findings and recommendations were discussed                            with the patient.                           - The findings and recommendations  were discussed                            with the patient's family. Procedure Code(s):        --- Professional ---                           [phone], Esophagogastroduodenoscopy, flexible,                            transoral; with transendoscopic ultrasound-guided                            intramural or transmural fine needle                            aspiration/biopsy(s), (includes endoscopic                            ultrasound examination limited to the esophagus,                            stomach or duodenum, and adjacent structures) Diagnosis Code(s):        --- Professional ---                           K29.70, Gastritis, unspecified, without bleeding                           D49.0, Neoplasm of unspecified behavior of                            digestive system                           K31.89, Other diseases of stomach and duodenum                           I89.9, Noninfective disorder of lymphatic vessels                            and lymph nodes, unspecified                           K92.9, Disease of digestive system, unspecified                           R93.3, Abnormal findings on diagnostic imaging of                            other parts of digestive tract CPT copyright 2019 American Medical Association. All rights reserved. The codes documented in this report are preliminary and upon coder review may  be revised to meet current compliance requirements. Justice Britain, MD 01/10/2021 9:42:03 AM Number of Addenda: 0

## 2021-01-10 NOTE — Anesthesia Preprocedure Evaluation (Addendum)
Anesthesia Evaluation  Patient identified by MRN, date of birth, ID band Patient awake    Reviewed: Allergy & Precautions, H&P , NPO status , Patient's Chart, lab work & pertinent test results, reviewed documented beta blocker date and time   Airway Mallampati: II  TM Distance: >3 FB Neck ROM: full    Dental no notable dental hx. (+) Teeth Intact, Dental Advisory Given   Pulmonary neg pulmonary ROS,    Pulmonary exam normal breath sounds clear to auscultation       Cardiovascular Exercise Tolerance: Good negative cardio ROS   Rhythm:Regular Rate:Normal     Neuro/Psych Seizures -, Well Controlled,  Spina bifida with paraparesis  Neuromuscular disease negative psych ROS   GI/Hepatic Neg liver ROS, GERD  Medicated,Gastric mass Abnormal CT of abdomen   Endo/Other  negative endocrine ROS  Renal/GU negative Renal ROS  negative genitourinary   Musculoskeletal negative musculoskeletal ROS (+)   Abdominal   Peds  Hematology negative hematology ROS (+)   Anesthesia Other Findings   Reproductive/Obstetrics negative OB ROS                            Anesthesia Physical  Anesthesia Plan  ASA: III  Anesthesia Plan: General   Post-op Pain Management:    Induction:   PONV Risk Score and Plan: Propofol infusion  Airway Management Planned:   Additional Equipment:   Intra-op Plan:   Post-operative Plan:   Informed Consent: I have reviewed the patients History and Physical, chart, labs and discussed the procedure including the risks, benefits and alternatives for the proposed anesthesia with the patient or authorized representative who has indicated his/her understanding and acceptance.     Dental Advisory Given  Plan Discussed with: CRNA  Anesthesia Plan Comments:         Anesthesia Quick Evaluation

## 2021-01-10 NOTE — Transfer of Care (Signed)
Immediate Anesthesia Transfer of Care Note  Patient: Darryl Moran  Procedure(s) Performed: UPPER ENDOSCOPIC ULTRASOUND (EUS) RADIAL (N/A ) BIOPSY FINE NEEDLE ASPIRATION (FNA) LINEAR  Patient Location: PACU  Anesthesia Type:MAC  Level of Consciousness: drowsy and patient cooperative  Airway & Oxygen Therapy: Patient Spontanous Breathing and Patient connected to nasal cannula oxygen  Post-op Assessment: Report given to RN and Post -op Vital signs reviewed and stable  Post vital signs: Reviewed and stable  Last Vitals:  Vitals Value Taken Time  BP 128/92 01/10/21 0930  Temp    Pulse 92 01/10/21 0932  Resp 11 01/10/21 0932  SpO2 100 % 01/10/21 0932  Vitals shown include unvalidated device data.  Last Pain:  Vitals:   01/10/21 0724  TempSrc: Oral  PainSc: 0-No pain         Complications: No complications documented.

## 2021-01-10 NOTE — H&P (Signed)
GASTROENTEROLOGY PROCEDURE H&P NOTE   Primary Care Physician: Ludwig Clarks, FNP  HPI: Darryl Moran is a 45 y.o. 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  Past Medical History:  Diagnosis Date  . GERD (gastroesophageal reflux disease)   . GERD (gastroesophageal reflux disease)   . Seizure ([place])    unknown etiology and on keppra now. last seizure was 5 years ago as of 11/30/2020.  Marland Kitchen Spina bifida [facility])    Past Surgical History:  Procedure Laterality Date  . BIOPSY  12/04/2020   Procedure: BIOPSY;  Surgeon: [name], DO;  Location: [facility] ENDO SUITE;  Service: Endoscopy;;  . ESOPHAGOGASTRODUODENOSCOPY (EGD) WITH PROPOFOL N/A 12/04/2020   Procedure: ESOPHAGOGASTRODUODENOSCOPY (EGD) WITH PROPOFOL;  Surgeon: [name], DO;  Location: [facility] ENDO SUITE;  Service: Endoscopy;  Laterality: N/A;  9:00am  . FRACTURE SURGERY Left    metal plate placed in leg at [facility] for stabilization per mother 12/30/20  . SHUNT REPLACEMENT    . SHUNT REVISION    . SPINE SURGERY     Current Facility-Administered Medications  Medication Dose Route Frequency Provider Last Rate Last Admin  . 0.9 %  sodium chloride infusion   Intravenous Continuous Mansouraty, Telford Nab., MD      . lactated ringers infusion   Intravenous Continuous Mansouraty, Telford Nab., MD       Allergies  Allergen Reactions  . Latex Anaphylaxis  . Amoxicillin Rash   History reviewed. No pertinent family history. Social History   Socioeconomic History  . Marital status: Single    Spouse name: Not on file  . Number of children: Not on file  . Years of education: Not on file  . Highest education level: Not on file  Occupational History  . Not on file  Tobacco Use  . Smoking status: Never Smoker  . Smokeless tobacco: Never Used  Vaping Use  . Vaping Use: Never used  Substance and Sexual Activity  . Alcohol use: Not Currently  . Drug use: Not Currently  .  Sexual activity: Never  Other Topics Concern  . Not on file  Social History Narrative  . Not on file   Social Determinants of Health   Financial Resource Strain: Not on file  Food Insecurity: Not on file  Transportation Needs: Not on file  Physical Activity: Not on file  Stress: Not on file  Social Connections: Not on file  Intimate Partner Violence: Not on file    Physical Exam: Vital signs in last 24 hours: Temp:  [98.2 F (36.8 C)] 98.2 F (36.8 C) (03/31 0724) Pulse Rate:  [115] 115 (03/31 0724) Resp:  [15] 15 (03/31 0724) BP: (148)/(97) 148/97 (03/31 0724) SpO2:  [99 %] 99 % (03/31 0724) Weight:  [61.2 kg] 61.2 kg (03/31 0724)   GEN: NAD EYE: Sclerae anicteric ENT: MMM CV: Non-tachycardic GI: Soft, NT/ND NEURO:  Alert & Oriented x 3  Lab Results: No results for input(s): WBC, HGB, HCT, PLT in the last 72 hours. BMET No results for input(s): NA, K, CL, CO2, GLUCOSE, BUN, CREATININE, CALCIUM in the last 72 hours. LFT No results for input(s): PROT, ALBUMIN, AST, ALT, ALKPHOS, BILITOT, BILIDIR, IBILI in the last 72 hours. PT/INR No results for input(s): LABPROT, INR in the last 72 hours.   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  The risks of an EUS including intestinal perforation, bleeding, infection, aspiration, and medication effects were discussed as was the possibility it may not give a definitive diagnosis if a biopsy is performed.  When a biopsy of the pancreas is done as part of the EUS, there is an additional risk of pancreatitis at the rate of about 1-2%.  It was explained that procedure related pancreatitis is typically mild, although it can be severe and even life threatening, which is why we do not perform random pancreatic biopsies and only biopsy a lesion/area we feel is concerning enough to warrant the risk.  The risks and benefits of endoscopic evaluation were  discussed with the patient; these include but are not limited to the risk of perforation, infection, bleeding, missed lesions, lack of diagnosis, severe illness requiring hospitalization, as well as anesthesia and sedation related illnesses.  The patient is agreeable to proceed.    Justice Britain, MD [facility] Advanced Endoscopy Office # [phone]

## 2021-01-11 ENCOUNTER — Encounter (HOSPITAL_COMMUNITY): Payer: Self-pay | Admitting: Gastroenterology

## 2021-01-14 ENCOUNTER — Encounter: Payer: Self-pay | Admitting: Gastroenterology

## 2021-01-14 LAB — SURGICAL PATHOLOGY

## 2021-01-15 ENCOUNTER — Other Ambulatory Visit: Payer: Self-pay

## 2021-01-15 DIAGNOSIS — C49A0 Gastrointestinal stromal tumor, unspecified site: Secondary | ICD-10-CM

## 2021-01-15 LAB — CYTOLOGY - NON PAP

## 2021-01-16 ENCOUNTER — Telehealth: Payer: Self-pay | Admitting: Hematology

## 2021-01-16 NOTE — Telephone Encounter (Signed)
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 Darryl Moran has been scheduled to see Dr. Burr Medico on 4/13 at 3pm. Appt date and time has been given to the pt's parents. Aware to arrive 20 minutes early. Both parents would like to be in the appt bc the pt has a disability. A msg has been sent to Dr. Burr Medico for permission.

## 2021-01-21 NOTE — Progress Notes (Signed)
[place]   [phone] Fax:([phone]) [place] Note   Patient Care Team: Ludwig Clarks, FNP as PCP - General (Family Medicine) Jonnie Finner, RN as Oncology Nurse Navigator Truitt Merle, MD as Consulting Physician (Oncology)  Date of Service:  01/23/2021   CHIEF COMPLAINTS/PURPOSE OF CONSULTATION:  Newly Diagnosed gastric GIST   REFERRING PHYSICIAN:  Dr. Rush Landmark  Oncology History Overview Note  Cancer Staging No matching staging information was found for the patient.    Gastrointestinal stromal tumor (GIST) of stomach ([place])  11/27/2020 Imaging   CT Angio 11/27/20  IMPRESSION: 1. No acute arterial abnormality detected. However, evaluation below the knees was somewhat limited by contrast timing. 2. Cholelithiasis without acute inflammation. 3. Distended urinary bladder with some mild bladder wall thickening. Correlate with urinalysis. Findings are suggestive of a neurogenic bladder. 4. Exophytic masslike area arising from the greater curvature of the gastric body. This is concerning for a malignancy such as a GIST. Outpatient GI follow-up is recommended.   12/04/2020 Procedure   Upper Endoscopy by Dr Abbey Chatters 12/04/20  IMPRESSION - Rule out malignancy, gastric tumor on the greater curvature of the stomach. Biopsied. - Gastritis. Biopsied. - Normal duodenal bulb, first portion of the duodenum and second portion of the duodenum.  FINAL MICROSCOPIC DIAGNOSIS: 12/04/20 A. STOMACH, MASS, BIOPSY:  - Gastric oxyntic mucosa with mild chronic gastritis  - Negative for intestinal metaplasia, dysplasia or malignancy  - No submucosa is present for evaluation   B. STOMACH, ANTRUM AND BODY, BIOPSY:  - Gastric antral and oxyntic mucosa with mild chronic gastritis  - Gastric oxyntic mucosa with parietal cell hyperplasia as can be seen  in hypergastrinemic states such as PPI therapy.  - Warthin Starry stain is negative for Helicobacter  pylori    01/10/2021 Procedure   Upper EUS by Dr Rush Landmark 01/10/21 EGD Impression: - No gross lesions in esophagus. Z-line regular, 36 cm from the incisors. - Gastritis. Biopsied. - Rule out malignancy, gastric subepithelial lesion on the greater curvature of the stomach was noted. - No gross lesions in the duodenal bulb, in the first portion of the duodenum and in the second portion of the duodenum. EUS Impression: - An intramural (subepithelial) lesion was found in the greater curve of the stomach. The lesion appeared to originate from within the muscularis propria (Layer 4). Cytology results are pending. However, the endosonographic appearance is highly suspicious for a stromal cell (smooth muscle) neoplasm. Fine needle biopsy performed. - No malignant-appearing lymph nodes were visualized in the celiac region (level 20), perigastric region and peripancreatic region.    FINAL MICROSCOPIC DIAGNOSIS: 01/10/21 A. STOMACH, BIOPSY:  - Gastric antral and oxyntic mucosa with slight chronic inflammation.  - Warthin-Starry negative for Helicobacter pylori.  - No intestinal metaplasia, dysplasia, GIST or carcinoma.     01/10/2021 Initial Biopsy   Initial Biopsy 01/10/21 FINAL MICROSCOPIC DIAGNOSIS:  A. STOMACH, FINE NEEDLE ASPIRATION:  - Neoplastic cells consistent with gastrointestinal stromal tumor  (GIST).  - See comment.   COMMENT:  The neoplastic cells have epithelioid morphology.  Immunohistochemistry  shows strong positivity with CD117 (c-kit) and CD34.  The neoplastic  cells are negative with cytokeratin AE1/AE3, CD56, chromogranin and  synaptophysin.  Immunohistochemistry with Ki-67 shows a low  proliferation rate.  The findings are consistent with gastrointestinal  stromal tumor (GIST) with epithelioid features.    01/22/2021 Imaging   CT Chest   Results still pending   01/23/2021 Initial Diagnosis  Gastrointestinal stromal tumor (GIST) of stomach ([facility])       HISTORY OF PRESENTING ILLNESS:  Darryl Moran 45 y.o. male is a here because of newly diagnosed GIST. The patient was referred by Dr Rush Landmark. The patient presents to the clinic today accompanied by his parents.   He has spastic nerve in his feet from his spina bifida. He had blackness of his toes. He was seen by PA who could not feel his pedal pulse. He was sent to ED and vascular work up showed mass in stomach. He was seen by GI, but first endoscopy was not sufficient. Repeat EUS by Dr. Doreene Eland confirmed GIST.   Patient denies stomach pain or bloating. He does have nausea and vomiting which is from his constipation. He also goes back and forth from his diarrhea in the last few days. Fiber bars has helped. He does enemas as well. He has stomach GERD and uses Prilosec. He eating and weight is stable. He denied bloody or black stool.   He has a PMHx of Seizures and Spina Bifida. He wears feet braces. He often has black or purple feet. He notes verse sensation stops at his ankle. He ambulates with crutches outside and uses wheelchair at home. 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 I reviewed his medication list with him and parents.   Socially he lives with his parents who assist with his care. He notes he is able to ADLs by himself. At home he is in a wheelchair but otherwise walks with crutches. He does not cook, but his parents do. He can otherwise to things himself.    REVIEW OF SYSTEMS:    Constitutional: Denies fevers, chills or abnormal night sweats Eyes: Denies blurriness of vision, double vision or watery eyes Ears, nose, mouth, throat, and face: Denies mucositis or sore throat Respiratory: Denies cough, dyspnea or wheezes Cardiovascular: Denies palpitation, chest discomfort or lower extremity swelling Gastrointestinal:  (+) Recent nausea/Vomiting/constipation/diarrhea  Skin: Denies abnormal skin rashes Lymphatics: Denies new lymphadenopathy or  easy bruising Neurological:Denies numbness, tingling or new weaknesses Behavioral/Psych: Mood is stable, no new changes  All other systems were reviewed with the patient and are negative.   MEDICAL HISTORY:  Past Medical History:  Diagnosis Date  . GERD (gastroesophageal reflux disease)   . GERD (gastroesophageal reflux disease)   . Seizure ([place])    unknown etiology and on keppra now. last seizure was 5 years ago as of 11/30/2020.  Marland Kitchen Spina bifida [facility])     SURGICAL HISTORY: Past Surgical History:  Procedure Laterality Date  . BIOPSY  12/04/2020   Procedure: BIOPSY;  Surgeon: [name], DO;  [facility];  Service: Endoscopy;;  . BIOPSY  01/10/2021   Procedure: BIOPSY;  Surgeon: Irving Copas., MD;  Location: [place];  Service: Gastroenterology;;  . ESOPHAGOGASTRODUODENOSCOPY (EGD) WITH PROPOFOL N/A 12/04/2020   Procedure: ESOPHAGOGASTRODUODENOSCOPY (EGD) WITH PROPOFOL;  Surgeon: [name], DO;  [facility];  Service: Endoscopy;  Laterality: N/A;  9:00am  . ESOPHAGOGASTRODUODENOSCOPY (EGD) WITH PROPOFOL N/A 01/10/2021   Procedure: ESOPHAGOGASTRODUODENOSCOPY (EGD) WITH PROPOFOL;  Surgeon: Rush Landmark Telford Nab., MD;  Location: [place];  Service: Gastroenterology;  Laterality: N/A;  . EUS N/A 01/10/2021   Procedure: UPPER ENDOSCOPIC ULTRASOUND (EUS) RADIAL;  Surgeon: Rush Landmark Telford Nab., MD;  Location: [place];  Service: Gastroenterology;  Laterality: N/A;  . FINE NEEDLE ASPIRATION  01/10/2021   Procedure: FINE NEEDLE ASPIRATION (FNA) LINEAR;  Surgeon: Irving Copas., MD;  Location: [place];  Service: Gastroenterology;;  . FRACTURE SURGERY Left    metal plate [facility] leg [facility] for stabilization per mother 12/30/20  . SHUNT REPLACEMENT    . SHUNT REVISION    . SPINE SURGERY      SOCIAL HISTORY: Social History   Socioeconomic History  . Marital status: Single    Spouse name: Not on file  .  Number of children: Not on file  . Years of education: Not on file  . Highest education level: Not on file  Occupational History  . Not on file  Tobacco Use  . Smoking status: Never Smoker  . Smokeless tobacco: Never Used  Vaping Use  . Vaping Use: Never used  Substance and Sexual Activity  . Alcohol use: Not Currently  . Drug use: Not Currently  . Sexual activity: Never  Other Topics Concern  . Not on file  Social History Narrative  . Not on file   Social Determinants of Health   Financial Resource Strain: Not on file  Food Insecurity: Not on file  Transportation Needs: Not on file  Physical Activity: Not on file  Stress: Not on file  Social Connections: Not on file  Intimate Partner Violence: Not on file    FAMILY HISTORY: History reviewed. No pertinent family history.  ALLERGIES:  is allergic to latex and amoxicillin.  MEDICATIONS:  Current Outpatient Medications  Medication Sig Dispense Refill  . Calcium Carbonate-Vitamin D 600-200 MG-UNIT TABS Take 1 tablet by mouth at bedtime.    . Cholecalciferol (VITAMIN D3) 125 MCG (5000 UT) CAPS Take 5,000 Units by mouth at bedtime.    Marland Kitchen imipramine (TOFRANIL) 50 MG tablet Take 50 mg by mouth at bedtime.    . levETIRAcetam (KEPPRA) 500 MG tablet Take 250-500 mg by mouth See admin instructions. Take 250 mg by mouth in the morning and 500 at night    . Multiple Vitamin (DAILY VITAMINS) tablet Take 1 tablet by mouth at bedtime.    Marland Kitchen omeprazole (PRILOSEC) 20 MG capsule Take 1 capsule (20 mg total) by mouth 2 (two) times daily before a meal. 60 capsule 5  . oxybutynin (DITROPAN XL) 15 MG 24 hr tablet Take 15 mg by mouth in the morning.    Marland Kitchen oxybutynin (DITROPAN) 5 MG tablet Take 5 mg by mouth at bedtime.    . Saccharomyces boulardii (PROBIOTIC) 250 MG CAPS Take 250 mg by mouth daily.    . sertraline (ZOLOFT) 100 MG tablet Take 100 mg by mouth at bedtime.    . vitamin C (ASCORBIC ACID) 500 MG tablet Take 500 mg by mouth daily.      No current facility-administered medications for this visit.    PHYSICAL EXAMINATION: ECOG PERFORMANCE STATUS: 3 - Symptomatic, >50% confined to bed  Vitals:   01/23/21 1457  BP: 128/86  Pulse: (!) 115  Resp: 20  Temp: (!) 96.4 F (35.8 C)  SpO2: 98%   Filed Weights   01/23/21 1457  Weight: 136 lb 4.8 oz (61.8 kg)    GENERAL:alert, no distress and comfortable SKIN: skin color, texture, turgor are normal, no rashes or significant lesions, except diffuse purpura skin on feet with cold skin  EYES: normal, Conjunctiva are pink and non-injected, sclera clear  NECK: supple, thyroid normal size, non-tender, without nodularity LYMPH:  no palpable lymphadenopathy in the cervical, axillary  LUNGS: clear to auscultation and percussion with normal breathing effort HEART: regular rate & rhythm  and no murmurs and no lower extremity edema ABDOMEN:abdomen soft, non-tender and normal bowel sounds (+) surgical scars in right side abdomen from previous shunt surgery.  No organomegaly Musculoskeletal:no cyanosis of digits and no clubbing  NEURO: alert & oriented x 3 with fluent speech, no focal motor/sensory deficits  LABORATORY DATA:  I have reviewed the data as listed CBC Latest Ref Rng & Units 11/27/2020  WBC 4.0 - 10.5 K/uL 7.4  Hemoglobin 13.0 - 17.0 g/dL 98.1  Hematocrit 02.5 - 52.0 % 44.6  Platelets 150 - 400 K/uL 258    CMP Latest Ref Rng & Units 11/27/2020  Glucose 70 - 99 mg/dL 486(O)  BUN 6 - 20 mg/dL 13  Creatinine 8.24 - 1.75 mg/dL 3.01  Sodium 040 - 459 mmol/L 138  Potassium 3.5 - 5.1 mmol/L 3.4(L)  Chloride 98 - 111 mmol/L 102  CO2 22 - 32 mmol/L 28  Calcium 8.9 - 10.3 mg/dL 9.3     RADIOGRAPHIC STUDIES: I have personally reviewed the radiological images as listed and agreed with the findings in the report. No results found.  ASSESSMENT & PLAN:  Darryl Moran is a 45 y.o. Caucasian male with a history of GERD, Seizures, spina bifida    1. Gastrointestinal  stromal Tumor, cT2N0M0 stage IA -I reviewed image findings and pathology report with patient and parents in great detail.  -during his work up of his LE vascular issues, CT scan showed mass in stomach on 11/27/20  -His initial endoscopy was insufficient. His repeat Endoscopy with Dr Meridee Score on 01/10/21 showed an submucosal mass, biopsy showed neoplastic cells consistent with GIST.  -His CT chest from 01/22/21 showed know gastric mass, but no other thoracic concerns. I personally reviewed images with patient, but final report has not been done. I will update patient with any changes.  -I discussed given the small size of his tumor he will proceed with upfront surgery with Dr. Freida Busman on 02/04/21 for GIST removal.  -If he is found to have low grade GIST on pathology with low risk of recurrence, I will no recommend adjuvant treatment such as Gleevec.  -He can proceed with surveillance after surgery and he can follow up with me and/or Dr Freida Busman.    2. 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  -His Spina Bifida causes vascular issues of his LE. He has low pedal pulse and circulation with no sensation below ankle. He wears feet braces. His feet intermittent turn black and purple.  -He will continue his medications and f/u with other physicians.  -On Prilosec for GERD.  -He has recent constipation/diarrhea which leads to vomiting from what he ate. He manages on fiber bars and enemas as needed.  -He has shunt in place in abdomen.    3. Social support  -He lives with both his parents who are his primary care givers.  -He ambulates with crutches outside and uses wheelchair at home -He is able to take care of ADL overall independently. His parents cook for him.    PLAN:  -Proceed with surgery on 02/04/21.  -Phone visit in 3 months    No orders of the defined types were placed in this encounter.   All questions were answered. The patient  knows to call the clinic with any problems, questions or concerns. The total time spent in the appointment was 45 minutes.     Malachy Mood, MD 01/23/2021 5:02 PM  I, Delphina Cahill, am  acting as scribe for Birdia Jaycox, MD.   I have reviewed the above documentation for accuracy and completeness, and I agree with the above.     

## 2021-01-22 ENCOUNTER — Ambulatory Visit (HOSPITAL_COMMUNITY)
Admission: RE | Admit: 2021-01-22 | Discharge: 2021-01-22 | Disposition: A | Discharge status: Home / Self Care | Payer: Medicare Other | Source: Ambulatory Visit | Attending: Gastroenterology | Admitting: Gastroenterology

## 2021-01-22 ENCOUNTER — Ambulatory Visit: Payer: Self-pay | Admitting: Surgery

## 2021-01-22 ENCOUNTER — Other Ambulatory Visit: Payer: Self-pay

## 2021-01-22 DIAGNOSIS — C49A0 Gastrointestinal stromal tumor, unspecified site: Secondary | ICD-10-CM | POA: Insufficient documentation

## 2021-01-22 MED ORDER — IOHEXOL 300 MG/ML  SOLN
75.0000 mL | Freq: Once | INTRAMUSCULAR | Status: AC | PRN
  Administered 2021-01-22: 75 mL via INTRAVENOUS

## 2021-01-22 NOTE — H&P (View-Only) (Signed)
History of Present Illness (Yocelin Vanlue L. [name] MD; 01/22/2021 1:12 PM) [name] is a 45 year old male who presents with an abdominal mass.HPI: Darryl Moran is a 45 yo male who was referred for evaluation of a newly-diagnosed gastric GIST. He recently developed blue discoloration of his toes and underwent a CTA, which incidentally showed a mass on the greater curve of the stomach (digit discoloration was attributed to vasospasm). He underwent an EGD on 2/22, which showed a submucosal mass on the greater curve of the stomach. Biopsies were nondiagnostic. He was then referred to Dr. Rush Landmark and underwent EGD/EUS on 3/31, which showed an intramural mass originating from the muscularis propria. 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 [name] was referred to me. He has a chest CT scheduled for later today and will see Dr. Burr Medico tomorrow.  Darryl Moran is here with his parents today. He has been having nausea/vomiting and diarrhea for the last few days, and his parents report he frequently has episodes like this that resolve. He denies abdominal pain. Hgb was normal on CBC on 2/15. 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 His father reports the shunt has not been functional in over 10 years. Darryl Moran uses a wheel chair often and ambulates with crutches.  PMH: spina bifida, GERD, seizures (per neurology note from 01/15/21, [name] has not had a seizure since 2013)  PSH: myelomeningocele closure (infant), VP shunt placement, surgery on left leg for fracture  FHx: Grandmother had a small bowel tumor, unsure what type  Social: Denies use of tobacco/EtOH/drugs. Lives with parents.    Past Surgical History Darryl Forehand, CNA; 01/22/2021 10:51 AM) Hip Surgery  Left.  Diagnostic Studies History Darryl Forehand, CNA; 01/22/2021 10:51 AM) Colonoscopy   never  Allergies Darryl Forehand, CNA; 01/22/2021 10:53 AM) Amoxicillin *PENICILLINS*  Latex Exam Gloves *MEDICAL DEVICES AND SUPPLIES*  Allergies Reconciled   Medication History Darryl Forehand, CNA; 01/22/2021 10:53 AM) Ciprofloxacin HCl (500MG  Tablet, Oral) Active. Imipramine HCl (50MG  Tablet, Oral) Active. levETIRAcetam (500MG  Tablet, Oral) Active. Omeprazole (20MG  Capsule DR, Oral) Active. Oxybutynin Chloride (5MG  Tablet, Oral) Active. Oxybutynin Chloride ER (15MG  Tablet ER 24HR, Oral) Active. Sertraline HCl (100MG  Tablet, Oral) Active. Medications Reconciled  Social History Darryl Forehand, CNA; 01/22/2021 10:51 AM) No alcohol use  No caffeine use  No drug use  Tobacco use  Never smoker.  Family History Darryl Forehand, CNA; 01/22/2021 10:51 AM) Colon Polyps  Father. Hypertension  Father, Mother.  Other Problems Darryl Forehand, CNA; 01/22/2021 10:51 AM) Bladder Problems  Depression  Gastroesophageal Reflux Disease     Review of Systems [facility] CNA; 01/22/2021 10:52 AM) General Not Present- Appetite Loss, Chills, Fatigue, Fever, Night Sweats, Weight Gain and Weight Loss. Skin Not Present- Change in Wart/Mole, Dryness, Hives, Jaundice, New Lesions, Non-Healing Wounds, Rash and Ulcer. HEENT Present- Wears glasses/contact lenses. Not Present- Earache, Hearing Loss, Hoarseness, Nose Bleed, Oral Ulcers, Ringing in the Ears, Seasonal Allergies, Sinus Pain, Sore Throat, Visual Disturbances and Yellow Eyes. Gastrointestinal Present- Abdominal Pain, Excessive gas, Indigestion and Vomiting. Not Present- Bloating, Bloody Stool, Change in Bowel Habits, Chronic diarrhea, Constipation, Difficulty Swallowing, Gets full quickly at meals, Hemorrhoids, Nausea and Rectal Pain. Male Genitourinary Present- Urine Leakage. Not Present- Blood in Urine, Change in Urinary Stream, Frequency, Impotence, Nocturia, Painful Urination and Urgency.  Vitals Darryl Forehand  CNA; 01/22/2021 10:53 AM) 01/22/2021 10:53 AM  Weight: 137 lb Height: 59in Body Surface Area: 1.57 m Body Mass Index: 27.67 kg/m  Temp.: 4F  Pulse: 128 (Regular)  P.OX: 95% (Room air) BP: 120/80(Sitting, Left Arm, Standard)   Physical Exam (Jaxen Samples L. [name] MD; 01/22/2021 1:13 PM) The physical exam findings are as follows: Note: Constitutional: No acute distress; conversant; no deformities Neuro: alert and oriented Eyes: Moist conjunctiva; anicteric sclerae; extraocular movements in tact Neck: Trachea midline Lungs: Normal respiratory effort; lungs clear to auscultation bilaterally; symmetric chest wall expansion CV: Regular rate and rhythm; no murmurs; no pitting edema GI: Abdomen soft, nontender, nondistended; no masses or organomegaly; multiple well-healed surgical scars MSK: no clubbing/cyanosis Psychiatric: Appropriate affect; alert and oriented 3    Assessment & Plan (Josia Cueva L. [name] MD; 01/22/2021 1:16 PM) GASTROINTESTINAL STROMAL TUMOR (GIST) OF STOMACH (C49.A2) Story: 45 yo male with a newly-diagnosed GIST of the greater curve of the stomach. I personally reviewed his EGD reports and CT scan. There is a mass approximately 2.5cm in diameter on the greater curve of the stomach, appears to be more proximal near the spleen. No evidence of metastatic disease. This appears amenable to resection via a laparoscopic gastric wedge resection. I discussed the details of this procedure with [name] and his family. Plan for admission for 2-3 days after surgery. Given the small size of the tumor I do not feel he needs gleevec prior to surgery, but I will discuss with Dr. Burr Medico. Will submit surgery scheduling request today. He will undergo CT chest today to complete staging workup.  Michaelle Birks, [place] [facility] General, Hepatobiliary and Pancreatic Surgery 01/22/21 1:17 PM

## 2021-01-22 NOTE — H&P (Signed)
History of Present Illness (Darryl Moran L. 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 He recently developed blue discoloration of his toes and underwent a CTA, which incidentally showed a mass on the greater curve of the stomach (digit discoloration was attributed to vasospasm). He underwent an EGD on 2/22, which showed a submucosal mass on the greater curve of the stomach. Biopsies were nondiagnostic. He was then referred to Dr. Rush Moran and underwent EGD/EUS on 3/31, which showed an intramural mass originating from the muscularis propria. 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 The patient was referred to me. He has a chest CT scheduled for later today and will see Dr. Burr Moran tomorrow.  Patient is here with his parents today. He has been having nausea/vomiting and diarrhea for the last few days, and his parents report he frequently has episodes like this that resolve. He denies abdominal pain. Hgb was normal on CBC on 2/15. 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 His father reports the shunt has not been functional in over 10 years. Patient uses a wheel chair often and ambulates with crutches.  PMH: spina bifida, GERD, seizures (per neurology note from 01/15/21, the patient has not had a seizure since 2013)  PSH: myelomeningocele closure (infant), VP shunt placement, Moran on left leg for fracture  FHx: Grandmother had a small bowel tumor, unsure what type  Social: Denies use of tobacco/EtOH/drugs. Lives with parents.    Past Surgical History Darryl Forehand, CNA; 01/22/2021 10:51 AM) Hip Moran  Left.  Diagnostic Studies History Darryl Forehand, CNA; 01/22/2021 10:51 AM) Colonoscopy   never  Allergies Darryl Forehand, CNA; 01/22/2021 10:53 AM) Amoxicillin *PENICILLINS*  Latex Exam Gloves *MEDICAL DEVICES AND SUPPLIES*  Allergies Reconciled   Medication History Darryl Forehand, CNA; 01/22/2021 10:53 AM) Ciprofloxacin HCl (500MG  Tablet, Oral) Active. Imipramine HCl (50MG  Tablet, Oral) Active. levETIRAcetam (500MG  Tablet, Oral) Active. Omeprazole (20MG  Capsule DR, Oral) Active. Oxybutynin Chloride (5MG  Tablet, Oral) Active. Oxybutynin Chloride ER (15MG  Tablet ER 24HR, Oral) Active. Sertraline HCl (100MG  Tablet, Oral) Active. Medications Reconciled  Social History Darryl Forehand, CNA; 01/22/2021 10:51 AM) No alcohol use  No caffeine use  No drug use  Tobacco use  Never smoker.  Family History Darryl Forehand, CNA; 01/22/2021 10:51 AM) Colon Polyps  Father. Hypertension  Father, Mother.  Other Problems Darryl Forehand, CNA; 01/22/2021 10:51 AM) Bladder Problems  Depression  Gastroesophageal Reflux Disease     Review of Systems [facility]ryl [facility] CNA; 01/22/2021 10:52 AM) Moran Not Present- Appetite Loss, Chills, Fatigue, Fever, Night Sweats, Weight Gain and Weight Loss. Skin Not Present- Change in Wart/Mole, Dryness, Hives, Jaundice, New Lesions, Non-Healing Wounds, Rash and Ulcer. HEENT Present- Wears glasses/contact lenses. Not Present- Earache, Hearing Loss, Hoarseness, Nose Bleed, Oral Ulcers, Ringing in the Ears, Seasonal Allergies, Sinus Pain, Sore Throat, Visual Disturbances and Yellow Eyes. Gastrointestinal Present- Abdominal Pain, Excessive gas, Indigestion and Vomiting. Not Present- Bloating, Bloody Stool, Change in Bowel Habits, Chronic diarrhea, Constipation, Difficulty Swallowing, Gets full quickly at meals, Hemorrhoids, Nausea and Rectal Pain. Male Genitourinary Present- Urine Leakage. Not Present- Blood in Urine, Change in Urinary Stream, Frequency, Impotence, Nocturia, Painful Urination and Urgency.  Vitals Darryl Forehand  CNA; 01/22/2021 10:53 AM) 01/22/2021 10:53 AM  Weight: 137 lb Height: 59in Body Surface Area: 1.57 m Body Mass Index: 27.67 kg/m  Temp.: 70F  Pulse: 128 (Regular)  P.OX: 95% (Room air) BP: 120/80(Sitting, Left Arm, Standard)   Physical Exam (Darryl Moran L. Darryl Resides MD; 01/22/2021 1:13 PM) The physical exam findings are as follows: Note: Constitutional: No acute distress; conversant; no deformities Neuro: alert and oriented Eyes: Moist conjunctiva; anicteric sclerae; extraocular movements in tact Neck: Trachea midline Lungs: Normal respiratory effort; lungs clear to auscultation bilaterally; symmetric chest wall expansion CV: Regular rate and rhythm; no murmurs; no pitting edema GI: Abdomen soft, nontender, nondistended; no masses or organomegaly; multiple well-healed surgical scars MSK: no clubbing/cyanosis Psychiatric: Appropriate affect; alert and oriented 3    Assessment & Plan (Darryl Moran L. 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 I personally reviewed his EGD reports and CT scan. There is a mass approximately 2.5cm in diameter on the greater curve of the stomach, appears to be more proximal near the spleen. No evidence of metastatic disease. This appears amenable to resection via a laparoscopic gastric wedge resection. I discussed the details of this procedure with the patient and his family. Plan for admission for 2-3 days after Moran. Given the small size of the tumor I do not feel he needs gleevec prior to Moran, but I will discuss with Dr. Burr Moran. Will submit Moran scheduling request today. He will undergo CT chest today to complete staging workup.  Darryl Moran, Darryl Moran, Darryl Moran 01/22/21 1:17 PM

## 2021-01-23 ENCOUNTER — Encounter: Payer: Self-pay | Admitting: Hematology

## 2021-01-23 ENCOUNTER — Inpatient Hospital Stay: Payer: Medicare Other | Attending: Hematology | Admitting: Hematology

## 2021-01-23 DIAGNOSIS — C49A2 Gastrointestinal stromal tumor of stomach: Secondary | ICD-10-CM | POA: Insufficient documentation

## 2021-01-23 DIAGNOSIS — K219 Gastro-esophageal reflux disease without esophagitis: Secondary | ICD-10-CM | POA: Diagnosis not present

## 2021-01-23 DIAGNOSIS — Q059 Spina bifida, unspecified: Secondary | ICD-10-CM | POA: Insufficient documentation

## 2021-01-23 DIAGNOSIS — R569 Unspecified convulsions: Secondary | ICD-10-CM | POA: Diagnosis not present

## 2021-01-24 ENCOUNTER — Telehealth: Payer: Self-pay | Admitting: Hematology

## 2021-01-24 NOTE — Telephone Encounter (Signed)
Scheduled per los. Called and spoke with patient. Confirmed appt 

## 2021-01-24 NOTE — Progress Notes (Signed)
Met with patient and his parents Gwyndolyn Saxon and Mardene Celeste today at initial medical oncology consult with Dr. Truitt Merle.   I explained my role as nurse navigator and they were given my direct contact information.  He is already scheduled for surgery on 02/04/2021 with Dr. Michaelle Birks.  All of their questions were answered.

## 2021-01-31 ENCOUNTER — Other Ambulatory Visit (HOSPITAL_COMMUNITY)
Admission: RE | Admit: 2021-01-31 | Discharge: 2021-01-31 | Disposition: A | Discharge status: Home / Self Care | Payer: Medicare Other | Source: Ambulatory Visit | Attending: Surgery | Admitting: Surgery

## 2021-01-31 DIAGNOSIS — Z01812 Encounter for preprocedural laboratory examination: Secondary | ICD-10-CM | POA: Diagnosis present

## 2021-01-31 DIAGNOSIS — Z20822 Contact with and (suspected) exposure to covid-19: Secondary | ICD-10-CM | POA: Insufficient documentation

## 2021-01-31 LAB — SARS CORONAVIRUS 2 (TAT 6-24 HRS): SARS Coronavirus 2: NEGATIVE

## 2021-02-01 ENCOUNTER — Encounter (HOSPITAL_COMMUNITY): Payer: Self-pay | Admitting: Surgery

## 2021-02-01 NOTE — Progress Notes (Signed)
Spoke with pt's mother, Heith Haigler. Pt's father is pt's legal guardian but he asked that I speak with Mrs. Barnier for the pre-op call. Pt has hx of Spinal bifida, seizures and some cognitive issues. She states can speak and answer questions, but he doesn't do it often. She states that she or his father will need to be with him in pre-op and after surgery. Pt self caths, but will probably need a foley. Pt is not diabetic, does not have a cardiac history and no history of HTN.   Covid test done 01/31/21 and it's negative.  Mrs. Birman states pt has been in quarantine since the test was done and understands that he stays in quarantine until he comes to the hospital on Monday.   I have requested last office visit notes and any recent labs, EKG, heart studies from pt's PCP, Westley Chandler, FNP at [facility] in [place].Marland Kitchen

## 2021-02-04 ENCOUNTER — Inpatient Hospital Stay (HOSPITAL_COMMUNITY)
Admission: RE | Admit: 2021-02-04 | Discharge: 2021-02-06 | DRG: 328 | Disposition: A | Discharge status: Home / Self Care | Payer: Medicare Other | Attending: Surgery | Admitting: Surgery

## 2021-02-04 ENCOUNTER — Other Ambulatory Visit: Payer: Self-pay

## 2021-02-04 ENCOUNTER — Inpatient Hospital Stay (HOSPITAL_COMMUNITY): Payer: Medicare Other | Admitting: Physician Assistant

## 2021-02-04 ENCOUNTER — Encounter (HOSPITAL_COMMUNITY)
Admission: RE | Disposition: A | Discharge status: Home / Self Care | Payer: Self-pay | Source: Home / Self Care | Attending: Surgery

## 2021-02-04 ENCOUNTER — Encounter (HOSPITAL_COMMUNITY): Payer: Self-pay | Admitting: Surgery

## 2021-02-04 DIAGNOSIS — Z87728 Personal history of other specified (corrected) congenital malformations of nervous system and sense organs: Secondary | ICD-10-CM

## 2021-02-04 DIAGNOSIS — F32A Depression, unspecified: Secondary | ICD-10-CM | POA: Diagnosis present

## 2021-02-04 DIAGNOSIS — Z982 Presence of cerebrospinal fluid drainage device: Secondary | ICD-10-CM | POA: Diagnosis not present

## 2021-02-04 DIAGNOSIS — Z880 Allergy status to penicillin: Secondary | ICD-10-CM

## 2021-02-04 DIAGNOSIS — Z8371 Family history of colonic polyps: Secondary | ICD-10-CM | POA: Diagnosis not present

## 2021-02-04 DIAGNOSIS — K660 Peritoneal adhesions (postprocedural) (postinfection): Secondary | ICD-10-CM | POA: Diagnosis present

## 2021-02-04 DIAGNOSIS — Z91040 Latex allergy status: Secondary | ICD-10-CM

## 2021-02-04 DIAGNOSIS — R569 Unspecified convulsions: Secondary | ICD-10-CM | POA: Diagnosis present

## 2021-02-04 DIAGNOSIS — K219 Gastro-esophageal reflux disease without esophagitis: Secondary | ICD-10-CM | POA: Diagnosis present

## 2021-02-04 DIAGNOSIS — Z79899 Other long term (current) drug therapy: Secondary | ICD-10-CM

## 2021-02-04 DIAGNOSIS — C49A2 Gastrointestinal stromal tumor of stomach: Secondary | ICD-10-CM | POA: Diagnosis present

## 2021-02-04 HISTORY — PX: LAPAROSCOPIC PARTIAL GASTRECTOMY: SHX5908

## 2021-02-04 LAB — BASIC METABOLIC PANEL
Anion gap: 7 (ref 5–15)
BUN: 15 mg/dL (ref 6–20)
CO2: 31 mmol/L (ref 22–32)
Calcium: 9.5 mg/dL (ref 8.9–10.3)
Chloride: 100 mmol/L (ref 98–111)
Creatinine, Ser: 0.61 mg/dL (ref 0.61–1.24)
GFR, Estimated: >60 mL/min (ref >60)
Glucose, Bld: 87 mg/dL (ref 70–99)
Potassium: 3.8 mmol/L (ref 3.5–5.1)
Sodium: 138 mmol/L (ref 135–145)

## 2021-02-04 LAB — CBC
HCT: 45.5 % (ref 39.0–52.0)
Hemoglobin: 14.8 g/dL (ref 13.0–17.0)
MCH: 29.7 pg (ref 26.0–34.0)
MCHC: 32.5 g/dL (ref 30.0–36.0)
MCV: 91.4 fL (ref 80.0–100.0)
Platelets: 283 10*3/uL (ref 150–400)
RBC: 4.98 MIL/uL (ref 4.22–5.81)
RDW: 12.4 % (ref 11.5–15.5)
WBC: 7.8 10*3/uL (ref 4.0–10.5)
nRBC: 0 % (ref 0.0–0.2)

## 2021-02-04 LAB — ABO/RH: ABO/RH(D): O POS

## 2021-02-04 LAB — TYPE AND SCREEN
ABO/RH(D): O POS
Antibody Screen: NEGATIVE

## 2021-02-04 SURGERY — LAPAROSCOPIC PARTIAL GASTRECTOMY
Anesthesia: General | Site: Abdomen

## 2021-02-04 MED ORDER — PANTOPRAZOLE SODIUM 40 MG PO TBEC
40.0000 mg | DELAYED_RELEASE_TABLET | Freq: Every day | ORAL | Status: DC
  Administered 2021-02-05 – 2021-02-06 (×2): 40 mg via ORAL
  Filled 2021-02-04 (×2): qty 1

## 2021-02-04 MED ORDER — PROMETHAZINE HCL 25 MG/ML IJ SOLN: 6.2500 mg | INTRAMUSCULAR | Status: DC | PRN

## 2021-02-04 MED ORDER — LACTATED RINGERS IV SOLN: INTRAVENOUS | Status: DC

## 2021-02-04 MED ORDER — HYDROMORPHONE HCL 1 MG/ML IJ SOLN: 0.5000 mg | INTRAMUSCULAR | Status: DC | PRN

## 2021-02-04 MED ORDER — MIDAZOLAM HCL 5 MG/5ML IJ SOLN
INTRAMUSCULAR | Status: DC | PRN
  Administered 2021-02-04 (×2): 1 mg via INTRAVENOUS

## 2021-02-04 MED ORDER — ESMOLOL HCL 100 MG/10ML IV SOLN
INTRAVENOUS | Status: DC | PRN
  Administered 2021-02-04: 30 mg via INTRAVENOUS
  Administered 2021-02-04 (×2): 20 mg via INTRAVENOUS

## 2021-02-04 MED ORDER — SUGAMMADEX SODIUM 200 MG/2ML IV SOLN
INTRAVENOUS | Status: DC | PRN
  Administered 2021-02-04: 150 mg via INTRAVENOUS

## 2021-02-04 MED ORDER — OXYBUTYNIN CHLORIDE 5 MG PO TABS
5.0000 mg | ORAL_TABLET | Freq: Every day | ORAL | Status: DC
  Administered 2021-02-04 – 2021-02-06 (×3): 5 mg via ORAL
  Filled 2021-02-04 (×3): qty 1

## 2021-02-04 MED ORDER — CHLORHEXIDINE GLUCONATE 0.12 % MT SOLN: 15.0000 mL | Freq: Once | OROMUCOSAL | Status: AC

## 2021-02-04 MED ORDER — METRONIDAZOLE IN NACL 5-0.79 MG/ML-% IV SOLN
INTRAVENOUS | Status: AC
  Filled 2021-02-04: qty 100

## 2021-02-04 MED ORDER — ONDANSETRON HCL 4 MG/2ML IJ SOLN
INTRAMUSCULAR | Status: DC | PRN
  Administered 2021-02-04: 4 mg via INTRAVENOUS

## 2021-02-04 MED ORDER — ESMOLOL HCL 100 MG/10ML IV SOLN
INTRAVENOUS | Status: AC
  Filled 2021-02-04: qty 10

## 2021-02-04 MED ORDER — METRONIDAZOLE IN NACL 5-0.79 MG/ML-% IV SOLN
500.0000 mg | INTRAVENOUS | Status: AC
  Administered 2021-02-04: 500 mg via INTRAVENOUS

## 2021-02-04 MED ORDER — GABAPENTIN 300 MG PO CAPS: 300.0000 mg | ORAL_CAPSULE | ORAL | Status: AC

## 2021-02-04 MED ORDER — DEXAMETHASONE SODIUM PHOSPHATE 10 MG/ML IJ SOLN
INTRAMUSCULAR | Status: DC | PRN
  Administered 2021-02-04: 10 mg via INTRAVENOUS

## 2021-02-04 MED ORDER — OXYBUTYNIN CHLORIDE ER 15 MG PO TB24
15.0000 mg | ORAL_TABLET | Freq: Every morning | ORAL | Status: DC
  Administered 2021-02-05 – 2021-02-06 (×2): 15 mg via ORAL
  Filled 2021-02-04 (×3): qty 1

## 2021-02-04 MED ORDER — BUPIVACAINE-EPINEPHRINE 0.25% -1:200000 IJ SOLN
INTRAMUSCULAR | Status: DC | PRN
  Administered 2021-02-04: 5 mL

## 2021-02-04 MED ORDER — ORAL CARE MOUTH RINSE: 15.0000 mL | Freq: Once | OROMUCOSAL | Status: AC

## 2021-02-04 MED ORDER — GABAPENTIN 300 MG PO CAPS
ORAL_CAPSULE | ORAL | Status: AC
  Administered 2021-02-04: 300 mg via ORAL
  Filled 2021-02-04: qty 1

## 2021-02-04 MED ORDER — IMIPRAMINE HCL 25 MG PO TABS
50.0000 mg | ORAL_TABLET | Freq: Every day | ORAL | Status: DC
  Administered 2021-02-04 – 2021-02-05 (×2): 50 mg via ORAL
  Filled 2021-02-04 (×2): qty 2

## 2021-02-04 MED ORDER — VITAMIN D3 25 MCG (1000 UNIT) PO TABS
2000.0000 [IU] | ORAL_TABLET | Freq: Every morning | ORAL | Status: DC
  Filled 2021-02-04: qty 2

## 2021-02-04 MED ORDER — BUPIVACAINE-EPINEPHRINE (PF) 0.25% -1:200000 IJ SOLN
INTRAMUSCULAR | Status: AC
  Filled 2021-02-04: qty 30

## 2021-02-04 MED ORDER — CEFAZOLIN SODIUM-DEXTROSE 2-4 GM/100ML-% IV SOLN
2.0000 g | INTRAVENOUS | Status: AC
  Administered 2021-02-04: 2 g via INTRAVENOUS

## 2021-02-04 MED ORDER — ENOXAPARIN SODIUM 40 MG/0.4ML ~~LOC~~ SOLN
40.0000 mg | SUBCUTANEOUS | Status: DC
  Administered 2021-02-05 – 2021-02-06 (×2): 40 mg via SUBCUTANEOUS
  Filled 2021-02-04 (×2): qty 0.4

## 2021-02-04 MED ORDER — VITAMIN D 25 MCG (1000 UNIT) PO TABS
2000.0000 [IU] | ORAL_TABLET | Freq: Every day | ORAL | Status: DC
  Administered 2021-02-05 – 2021-02-06 (×2): 2000 [IU] via ORAL
  Filled 2021-02-04 (×2): qty 2

## 2021-02-04 MED ORDER — KETOROLAC TROMETHAMINE 30 MG/ML IJ SOLN
30.0000 mg | Freq: Once | INTRAMUSCULAR | Status: AC | PRN
  Administered 2021-02-04: 30 mg via INTRAVENOUS

## 2021-02-04 MED ORDER — PROPOFOL 10 MG/ML IV BOLUS
INTRAVENOUS | Status: AC
  Filled 2021-02-04: qty 20

## 2021-02-04 MED ORDER — FENTANYL CITRATE (PF) 250 MCG/5ML IJ SOLN
INTRAMUSCULAR | Status: AC
  Filled 2021-02-04: qty 5

## 2021-02-04 MED ORDER — DEXAMETHASONE SODIUM PHOSPHATE 10 MG/ML IJ SOLN
INTRAMUSCULAR | Status: AC
  Filled 2021-02-04: qty 1

## 2021-02-04 MED ORDER — ROCURONIUM BROMIDE 10 MG/ML (PF) SYRINGE
PREFILLED_SYRINGE | INTRAVENOUS | Status: DC | PRN
  Administered 2021-02-04: 10 mg via INTRAVENOUS
  Administered 2021-02-04: 20 mg via INTRAVENOUS
  Administered 2021-02-04: 40 mg via INTRAVENOUS

## 2021-02-04 MED ORDER — FENTANYL CITRATE (PF) 250 MCG/5ML IJ SOLN
INTRAMUSCULAR | Status: DC | PRN
  Administered 2021-02-04: 150 ug via INTRAVENOUS
  Administered 2021-02-04: 50 ug via INTRAVENOUS
  Administered 2021-02-04: 100 ug via INTRAVENOUS

## 2021-02-04 MED ORDER — OXYCODONE HCL 5 MG/5ML PO SOLN: 5.0000 mg | Freq: Once | ORAL | Status: DC | PRN

## 2021-02-04 MED ORDER — LEVETIRACETAM 500 MG PO TABS
500.0000 mg | ORAL_TABLET | Freq: Every day | ORAL | Status: DC
  Administered 2021-02-04 – 2021-02-05 (×2): 500 mg via ORAL
  Filled 2021-02-04 (×3): qty 1

## 2021-02-04 MED ORDER — LIDOCAINE 2% (20 MG/ML) 5 ML SYRINGE
INTRAMUSCULAR | Status: DC | PRN
  Administered 2021-02-04: 60 mg via INTRAVENOUS

## 2021-02-04 MED ORDER — ONDANSETRON HCL 4 MG/2ML IJ SOLN: 4.0000 mg | Freq: Four times a day (QID) | INTRAMUSCULAR | Status: DC | PRN

## 2021-02-04 MED ORDER — ACETAMINOPHEN 500 MG PO TABS
1000.0000 mg | ORAL_TABLET | Freq: Three times a day (TID) | ORAL | Status: DC
  Administered 2021-02-04 – 2021-02-06 (×5): 1000 mg via ORAL
  Filled 2021-02-04 (×5): qty 2

## 2021-02-04 MED ORDER — OXYCODONE HCL 5 MG PO TABS: 5.0000 mg | ORAL_TABLET | Freq: Once | ORAL | Status: DC | PRN

## 2021-02-04 MED ORDER — PHENYLEPHRINE 40 MCG/ML (10ML) SYRINGE FOR IV PUSH (FOR BLOOD PRESSURE SUPPORT)
PREFILLED_SYRINGE | INTRAVENOUS | Status: AC
  Filled 2021-02-04: qty 10

## 2021-02-04 MED ORDER — PROPOFOL 10 MG/ML IV BOLUS
INTRAVENOUS | Status: DC | PRN
  Administered 2021-02-04: 150 mg via INTRAVENOUS

## 2021-02-04 MED ORDER — 0.9 % SODIUM CHLORIDE (POUR BTL) OPTIME
TOPICAL | Status: DC | PRN
  Administered 2021-02-04 (×2): 1000 mL

## 2021-02-04 MED ORDER — CEFAZOLIN SODIUM-DEXTROSE 2-4 GM/100ML-% IV SOLN
INTRAVENOUS | Status: AC
  Filled 2021-02-04: qty 100

## 2021-02-04 MED ORDER — SERTRALINE HCL 100 MG PO TABS
100.0000 mg | ORAL_TABLET | Freq: Every day | ORAL | Status: DC
  Administered 2021-02-04 – 2021-02-05 (×2): 100 mg via ORAL
  Filled 2021-02-04 (×2): qty 1

## 2021-02-04 MED ORDER — ACETAMINOPHEN 500 MG PO TABS: 1000.0000 mg | ORAL_TABLET | ORAL | Status: AC

## 2021-02-04 MED ORDER — CHLORHEXIDINE GLUCONATE 0.12 % MT SOLN
OROMUCOSAL | Status: AC
  Administered 2021-02-04: 15 mL via OROMUCOSAL
  Filled 2021-02-04: qty 15

## 2021-02-04 MED ORDER — ONDANSETRON HCL 4 MG/2ML IJ SOLN
INTRAMUSCULAR | Status: AC
  Filled 2021-02-04: qty 2

## 2021-02-04 MED ORDER — FENTANYL CITRATE (PF) 100 MCG/2ML IJ SOLN: 25.0000 ug | INTRAMUSCULAR | Status: DC | PRN

## 2021-02-04 MED ORDER — ONDANSETRON 4 MG PO TBDP: 4.0000 mg | ORAL_TABLET | Freq: Four times a day (QID) | ORAL | Status: DC | PRN

## 2021-02-04 MED ORDER — KETOROLAC TROMETHAMINE 30 MG/ML IJ SOLN
INTRAMUSCULAR | Status: AC
  Filled 2021-02-04: qty 1

## 2021-02-04 MED ORDER — LEVETIRACETAM 250 MG PO TABS
250.0000 mg | ORAL_TABLET | Freq: Every morning | ORAL | Status: DC
  Administered 2021-02-05 – 2021-02-06 (×2): 250 mg via ORAL
  Filled 2021-02-04 (×3): qty 1

## 2021-02-04 MED ORDER — LIDOCAINE 2% (20 MG/ML) 5 ML SYRINGE
INTRAMUSCULAR | Status: AC
  Filled 2021-02-04: qty 10

## 2021-02-04 MED ORDER — SODIUM CHLORIDE 0.9 % IR SOLN
Status: DC | PRN
  Administered 2021-02-04: 1000 mL

## 2021-02-04 MED ORDER — MIDAZOLAM HCL 2 MG/2ML IJ SOLN
INTRAMUSCULAR | Status: AC
  Filled 2021-02-04: qty 2

## 2021-02-04 MED ORDER — ACETAMINOPHEN 500 MG PO TABS
ORAL_TABLET | ORAL | Status: AC
  Administered 2021-02-04: 1000 mg via ORAL
  Filled 2021-02-04: qty 2

## 2021-02-04 MED ORDER — OXYCODONE HCL 5 MG PO TABS: 5.0000 mg | ORAL_TABLET | ORAL | Status: DC | PRN

## 2021-02-04 MED ORDER — AMISULPRIDE (ANTIEMETIC) 5 MG/2ML IV SOLN: 10.0000 mg | Freq: Once | INTRAVENOUS | Status: DC | PRN

## 2021-02-04 MED ORDER — PHENYLEPHRINE HCL-NACL 10-0.9 MG/250ML-% IV SOLN
INTRAVENOUS | Status: DC | PRN
  Administered 2021-02-04: 25 ug/min via INTRAVENOUS

## 2021-02-04 SURGICAL SUPPLY — 54 items
ADH SKN CLS APL DERMABOND .7 (GAUZE/BANDAGES/DRESSINGS) ×1
APL PRP STRL LF DISP 70% ISPRP (MISCELLANEOUS) ×1
APPLIER CLIP 5 13 M/L LIGAMAX5 (MISCELLANEOUS) ×2
APR CLP MED LRG 5 ANG JAW (MISCELLANEOUS) ×1
BAG SPEC RTRVL LRG 6X4 10 (ENDOMECHANICALS) ×1
CANISTER SUCT 3000ML PPV (MISCELLANEOUS) ×2 IMPLANT
CHLORAPREP W/TINT 26 (MISCELLANEOUS) ×2 IMPLANT
CLIP APPLIE 5 13 M/L LIGAMAX5 (MISCELLANEOUS) IMPLANT
COVER SURGICAL LIGHT HANDLE (MISCELLANEOUS) ×3 IMPLANT
DERMABOND ADVANCED (GAUZE/BANDAGES/DRESSINGS) ×1
DERMABOND ADVANCED .7 DNX12 (GAUZE/BANDAGES/DRESSINGS) IMPLANT
DEVICE SUTURE ENDOST 10MM (ENDOMECHANICALS) ×1 IMPLANT
DRAPE WARM FLUID 44X44 (DRAPES) ×2 IMPLANT
ELECT REM PT RETURN 9FT ADLT (ELECTROSURGICAL) ×2
ELECTRODE REM PT RTRN 9FT ADLT (ELECTROSURGICAL) ×1 IMPLANT
GLOVE SURG UNDER LTX SZ6.5 (GLOVE) ×2 IMPLANT
GOWN STRL REUS W/ TWL LRG LVL3 (GOWN DISPOSABLE) ×6 IMPLANT
GOWN STRL REUS W/TWL LRG LVL3 (GOWN DISPOSABLE) ×12
KIT BASIN OR (CUSTOM PROCEDURE TRAY) ×2 IMPLANT
NDL INSUFFLATION 14GA 120MM (NEEDLE) IMPLANT
NEEDLE INSUFFLATION 14GA 120MM (NEEDLE) ×2 IMPLANT
NS IRRIG 1000ML POUR BTL (IV SOLUTION) ×4 IMPLANT
PAD ARMBOARD 7.5X6 YLW CONV (MISCELLANEOUS) ×4 IMPLANT
PENCIL BUTTON HOLSTER BLD 10FT (ELECTRODE) ×1 IMPLANT
POUCH SPECIMEN RETRIEVAL 10MM (ENDOMECHANICALS) ×1 IMPLANT
RELOAD ENDO STITCH 2.0 (ENDOMECHANICALS) ×4
RELOAD STAPLE 60 3.6 BLU REG (STAPLE) IMPLANT
RELOAD STAPLER BLUE 60MM (STAPLE) ×2 IMPLANT
RELOAD SUT SNGL STCH BLK 2-0 (ENDOMECHANICALS) IMPLANT
SCISSORS LAP 5X35 DISP (ENDOMECHANICALS) ×2 IMPLANT
SET IRRIG TUBING LAPAROSCOPIC (IRRIGATION / IRRIGATOR) ×1 IMPLANT
SET TUBE SMOKE EVAC HIGH FLOW (TUBING) ×2 IMPLANT
SHEARS HARMONIC ACE PLUS 36CM (ENDOMECHANICALS) ×2 IMPLANT
SLEEVE ENDOPATH XCEL 5M (ENDOMECHANICALS) ×3 IMPLANT
SPECIMEN JAR LARGE (MISCELLANEOUS) ×2 IMPLANT
STAPLE ECHEON FLEX 60 POW ENDO (STAPLE) ×1 IMPLANT
STAPLER RELOAD BLUE 60MM (STAPLE) ×4
SUT MNCRL AB 4-0 PS2 18 (SUTURE) ×1 IMPLANT
SUT PDS AB 1 TP1 96 (SUTURE) ×2 IMPLANT
SUT PROLENE 2 0 CT2 30 (SUTURE) IMPLANT
SUT PROLENE 2 0 KS (SUTURE) IMPLANT
SUT RELOAD ENDO STITCH 2.0 (ENDOMECHANICALS) ×2
SUT SILK 2 0 SH CR/8 (SUTURE) ×1 IMPLANT
SUT SILK 2 0 TIES 10X30 (SUTURE) ×1 IMPLANT
SUT SILK 3 0 SH CR/8 (SUTURE) ×1 IMPLANT
SUT SILK 3 0 TIES 10X30 (SUTURE) ×1 IMPLANT
SUTURE RELOAD ENDO STITCH 2.0 (ENDOMECHANICALS) ×2 IMPLANT
SYR BULB IRRIG 60ML STRL (SYRINGE) ×2 IMPLANT
TOWEL GREEN STERILE (TOWEL DISPOSABLE) ×4 IMPLANT
TRAY LAPAROSCOPIC MC (CUSTOM PROCEDURE TRAY) ×2 IMPLANT
TROCAR XCEL 12X100 BLDLESS (ENDOMECHANICALS) ×1 IMPLANT
TROCAR XCEL NON-BLD 5MMX100MML (ENDOMECHANICALS) ×3 IMPLANT
TUBING EVAC SMOKE HEATED PNEUM (TUBING) ×2 IMPLANT
WATER STERILE IRR 1000ML POUR (IV SOLUTION) ×2 IMPLANT

## 2021-02-04 NOTE — Transfer of Care (Signed)
Immediate Anesthesia Transfer of Care Note  Patient: Darryl Moran  Procedure(s) Performed: LAPAROSCOPIC PARTIAL GASTRECTOMY (N/A Abdomen)  Patient Location: PACU  Anesthesia Type:General  Level of Consciousness: awake and alert   Airway & Oxygen Therapy: Patient Spontanous Breathing and Patient connected to face mask oxygen  Post-op Assessment: Report given to RN and Post -op Vital signs reviewed and stable  Post vital signs: Reviewed and stable  Last Vitals:  Vitals Value Taken Time  BP 122/76 02/04/21 1244  Temp    Pulse 111 02/04/21 1246  Resp 8 02/04/21 1246  SpO2 100 % 02/04/21 1246  Vitals shown include unvalidated device data.  Last Pain:  Vitals:   02/04/21 0938  TempSrc:   PainSc: 0-No pain      Patients Stated Pain Goal: 3 (59/56/38 [id number])  Complications: No complications documented.

## 2021-02-04 NOTE — Op Note (Signed)
Date: 02/04/21  Patient: Darryl Moran MRN: [id number]  Preoperative Diagnosis: Gastric GIST Postoperative Diagnosis: Same  Procedure: Laparoscopic gastric wedge resection  Surgeon: Michaelle Birks, MD Assistant: Romana Juniper, MD  EBL: Minimal  Anesthesia: General  Specimens: Gastric mass  Indications: Darryl Moran is a 45 yo male who was referred with an incidental gastric mass diagnosed on CT scan. EGD with biopsy was consistent with a gastrointestinal stromal tumor. Staging scan showed no evidence of metastatic disease.  Findings: Exophytic gastric mass on the proximal posterior stomach near the greater curve, excised via gastric wedge resection.  Procedure details: Informed consent was obtained in the preoperative area prior to the procedure. The patient was brought to the operating room and placed on the table in the supine position. General anesthesia was induced and appropriate lines and drains were placed for intraoperative monitoring. Perioperative antibiotics were administered per SCIP guidelines. The abdomen was prepped and draped in the usual sterile fashion. A pre-procedure timeout was taken verifying patient identity, surgical site and procedure to be performed.  A small incision was made at Palmer's point and a Veress needle was inserted through the fascia. The abdomen was insufflated, a 18mm Visiport was placed, and the abdomen was inspected with no evidence of visceral or vascular injury. An additional 50mm port was placed in the left mid-abdomen under direct visualization. There were omental adhesions to the anterior abdomen wall in the RUQ, which were taken using sharp and blunt dissection.  The patient's VP shunt was visualized in the RUQ and was protected. Additional 13mm ports were placed under direct visualization in the left lateral abdomen and RUQ. A 57mm port was placed in the right mid-abdomen near the umbilicus. The gastrocolic omentum was opened on the body of the stomach  using Harmonic shears, and the lesser sac was entered. This dissection was carried proximally along the greater curve of the stomach. The short gastric vessels were divided. An exophytic mass was visualized on the posterior wall of the stomach near the fundus, close to the greater curve. The mass was approximately 3cm in diameter and was not adherent to any surrounding structures. Harmonic shears were used to excise the mass from the surrounding gastric tissue via a gastric wedge resection. The mass was placed in an Endocatch bag. 2-0 silk stay sutures were then placed on the corners and middle of the resulting gastrotomy, and the gastrotomy was closed with two blue loads of an Echelon 52mm stapler. Hemostasis was achieved on the staple line by placing clips. The surgical site was irrigated and no further bleeding was noted. The specimen was removed via the 51mm port and sent for routine pathology. The abdomen was again inspected and there was no evidence of bleeding. The ports were removed and the fascia at the 51mm port site was closed with 0 Vicryl. The skin at all port sites was closed with subcuticular 4-0 monocryl suture. Dermabond was applied.  The patient tolerated the procedure well with no apparent complications. All counts were correct x2 at the end of the procedure. The patient was extubated and taken to PACU in stable condition.  Michaelle Birks, MD 02/04/21 3:46 PM

## 2021-02-04 NOTE — Progress Notes (Signed)
Chari Manning., CRNA made aware that there is currently one staff member in the laboratory, and the lab work will be delayed. Blaire relayed the message to Dr. Roanna Banning from anesthesia.

## 2021-02-04 NOTE — Anesthesia Procedure Notes (Signed)
Procedure Name: Intubation Date/Time: 02/04/2021 10:36 AM Performed by: Candis Shine, CRNA Pre-anesthesia Checklist: Patient identified, Emergency Drugs available, Suction available and Patient being monitored Patient Re-evaluated:Patient Re-evaluated prior to induction Oxygen Delivery Method: Circle System Utilized Preoxygenation: Pre-oxygenation with 100% oxygen Induction Type: IV induction Ventilation: Mask ventilation without difficulty Laryngoscope Size: Mac and 3 Grade View: Grade I Tube type: Oral Tube size: 7.5 mm Number of attempts: 1 Airway Equipment and Method: Stylet Placement Confirmation: ETT inserted through vocal cords under direct vision,  positive ETCO2 and breath sounds checked- equal and bilateral Secured at: 22 cm Tube secured with: Tape Dental Injury: Teeth and Oropharynx as per pre-operative assessment

## 2021-02-04 NOTE — Anesthesia Preprocedure Evaluation (Addendum)
Anesthesia Evaluation  Patient identified by MRN, date of birth, ID band Patient awake    Reviewed: Allergy & Precautions, NPO status , Patient's Chart, lab work & pertinent test results  Airway Mallampati: III  TM Distance: >3 FB Neck ROM: Full    Dental no notable dental hx.    Pulmonary neg pulmonary ROS,    Pulmonary exam normal breath sounds clear to auscultation       Cardiovascular negative cardio ROS Normal cardiovascular exam Rhythm:Regular Rate:Normal     Neuro/Psych Seizures -, Well Controlled,  Spina bifida negative psych ROS   GI/Hepatic Neg liver ROS, GERD  Medicated and Controlled,  Endo/Other  negative endocrine ROS  Renal/GU negative Renal ROS     Musculoskeletal Uses braces for ambulation   Abdominal   Peds  Hematology negative hematology ROS (+)   Anesthesia Other Findings GIST GASTRIC  Reproductive/Obstetrics                            Anesthesia Physical Anesthesia Plan  ASA: II  Anesthesia Plan: General   Post-op Pain Management:    Induction: Intravenous  PONV Risk Score and Plan: 2  Airway Management Planned: Oral ETT  Additional Equipment:   Intra-op Plan:   Post-operative Plan: Extubation in OR  Informed Consent: I have reviewed the patients History and Physical, chart, labs and discussed the procedure including the risks, benefits and alternatives for the proposed anesthesia with the patient or authorized representative who has indicated his/her understanding and acceptance.     Dental advisory given  Plan Discussed with: CRNA  Anesthesia Plan Comments:        Anesthesia Quick Evaluation

## 2021-02-04 NOTE — Interval H&P Note (Signed)
History and Physical Interval Note:  02/04/2021 8:51 AM  [name]  has presented today for surgery, with the diagnosis of GIST GASTRIC.  The various methods of treatment have been discussed with the patient and family. After consideration of risks, benefits and other options for treatment, the patient has consented to  Procedure(s) with comments: LAPAROSCOPIC PARTIAL GASTRECTOMY (N/A) - 2 HRS as a surgical intervention.  The patient's history has been reviewed, patient examined, no change in status, stable for surgery.  I have reviewed the patient's chart and labs.  Questions were answered to the patient's satisfaction. Staging chest CT negative for metastatic disease.   Darryl Moran

## 2021-02-05 ENCOUNTER — Encounter (HOSPITAL_COMMUNITY): Payer: Self-pay | Admitting: Surgery

## 2021-02-05 LAB — CBC
HCT: 37 % — ABNORMAL LOW (ref 39.0–52.0)
Hemoglobin: 12.4 g/dL — ABNORMAL LOW (ref 13.0–17.0)
MCH: 30.2 pg (ref 26.0–34.0)
MCHC: 33.5 g/dL (ref 30.0–36.0)
MCV: 90.2 fL (ref 80.0–100.0)
Platelets: 264 10*3/uL (ref 150–400)
RBC: 4.1 MIL/uL — ABNORMAL LOW (ref 4.22–5.81)
RDW: 12.6 % (ref 11.5–15.5)
WBC: 11.8 10*3/uL — ABNORMAL HIGH (ref 4.0–10.5)
nRBC: 0 % (ref 0.0–0.2)

## 2021-02-05 LAB — BASIC METABOLIC PANEL
Anion gap: 8 (ref 5–15)
BUN: 10 mg/dL (ref 6–20)
CO2: 27 mmol/L (ref 22–32)
Calcium: 8.9 mg/dL (ref 8.9–10.3)
Chloride: 102 mmol/L (ref 98–111)
Creatinine, Ser: 0.67 mg/dL (ref 0.61–1.24)
GFR, Estimated: >60 mL/min (ref >60)
Glucose, Bld: 108 mg/dL — ABNORMAL HIGH (ref 70–99)
Potassium: 4.3 mmol/L (ref 3.5–5.1)
Sodium: 137 mmol/L (ref 135–145)

## 2021-02-05 LAB — MAGNESIUM: Magnesium: 1.9 mg/dL (ref 1.7–2.4)

## 2021-02-05 MED ORDER — KETOROLAC TROMETHAMINE 15 MG/ML IJ SOLN
15.0000 mg | Freq: Three times a day (TID) | INTRAMUSCULAR | Status: DC
  Administered 2021-02-05 – 2021-02-06 (×4): 15 mg via INTRAVENOUS
  Filled 2021-02-05 (×4): qty 1

## 2021-02-05 NOTE — Anesthesia Postprocedure Evaluation (Signed)
Anesthesia Post Note  Patient: Darryl Moran  Procedure(s) Performed: LAPAROSCOPIC PARTIAL GASTRECTOMY (N/A Abdomen)     Patient location during evaluation: PACU Anesthesia Type: General Level of consciousness: awake Pain management: pain level controlled Vital Signs Assessment: post-procedure vital signs reviewed and stable Respiratory status: spontaneous breathing, nonlabored ventilation, respiratory function stable and patient connected to nasal cannula oxygen Cardiovascular status: blood pressure returned to baseline and stable Postop Assessment: no apparent nausea or vomiting Anesthetic complications: no   No complications documented.  Last Vitals:  Vitals:   02/05/21 0221 02/05/21 0524  BP: 113/75 128/89  Pulse: (!) 104 100  Resp: 15 17  Temp: 36.8 C 36.8 C  SpO2: 95% 98%    Last Pain:  Vitals:   02/05/21 0730  TempSrc:   PainSc: 1                  Aelyn Stanaland P Izik Bingman

## 2021-02-05 NOTE — Plan of Care (Signed)
  Problem: Education: Goal: Knowledge of General Education information will improve Description: Including pain rating scale, medication(s)/side effects and non-pharmacologic comfort measures Outcome: Progressing   Problem: Education: Goal: Knowledge of General Education information will improve Description: Including pain rating scale, medication(s)/side effects and non-pharmacologic comfort measures Outcome: Progressing   Problem: Education: Goal: Knowledge of General Education information will improve Description: Including pain rating scale, medication(s)/side effects and non-pharmacologic comfort measures Outcome: Progressing   Problem: Health Behavior/Discharge Planning: Goal: Ability to manage health-related needs will improve Outcome: Progressing   Problem: Clinical Measurements: Goal: Ability to maintain clinical measurements within normal limits will improve Outcome: Progressing Goal: Will remain free from infection Outcome: Progressing Goal: Diagnostic test results will improve Outcome: Progressing Goal: Respiratory complications will improve Outcome: Progressing Goal: Cardiovascular complication will be avoided Outcome: Progressing

## 2021-02-05 NOTE — Plan of Care (Signed)
  Problem: Education: Goal: Knowledge of General Education information will improve Description: Including pain rating scale, medication(s)/side effects and non-pharmacologic comfort measures Outcome: Progressing   Problem: Clinical Measurements: Goal: Will remain free from infection Outcome: Progressing   Problem: Pain Managment: Goal: General experience of comfort will improve Outcome: Progressing   

## 2021-02-05 NOTE — Progress Notes (Signed)
    1 Day Post-Op  Subjective: No acute issues overnight. Afebrile, vitals stable. Patient reports difficulty with pain control this morning, has not received any pain meds overnight. No nausea/vomiting.   Objective: Vital signs in last 24 hours: Temp:  [97.2 F (36.2 C)-98.7 F (37.1 C)] 98.2 F (36.8 C) (04/26 0524) Pulse Rate:  [99-110] 100 (04/26 0524) Resp:  [10-18] 17 (04/26 0524) BP: (113-142)/(75-99) 128/89 (04/26 0524) SpO2:  [91 %-100 %] 98 % (04/26 0524) Weight:  [61.2 kg] 61.2 kg (04/25 0815) Last BM Date: 02/04/21  Intake/Output from previous day: 04/25 0701 - 04/26 0700 In: 1739.3 [I.V.:1739.3] Out: 590 [Urine:580; Blood:10] Intake/Output this shift: No intake/output data recorded.  PE: General: resting comfortably, NAD Neuro: alert and oriented Resp: normal work of breathing on room air Abdomen: soft, nondistended, nontender to palpation. Incisions clean and dry with no erythema or induration. Extremities: warm and well-perfused  Lab Results:  Recent Labs    02/04/21 0917 02/05/21 0158  WBC 7.8 11.8*  HGB 14.8 12.4*  HCT 45.5 37.0*  PLT 283 264   BMET Recent Labs    02/04/21 0917 02/05/21 0158  NA 138 137  K 3.8 4.3  CL 100 102  CO2 31 27  GLUCOSE 87 108*  BUN 15 10  CREATININE 0.61 0.67  CALCIUM 9.5 8.9   PT/INR No results for input(s): LABPROT, INR in the last 72 hours. CMP     Component Value Date/Time   NA 137 02/05/2021 0158   K 4.3 02/05/2021 0158   CL 102 02/05/2021 0158   CO2 27 02/05/2021 0158   GLUCOSE 108 (H) 02/05/2021 0158   BUN 10 02/05/2021 0158   CREATININE 0.67 02/05/2021 0158   CALCIUM 8.9 02/05/2021 0158   GFRNONAA >60 02/05/2021 0158   Lipase  No results found for: LIPASE     Studies/Results: No results found.  Anti-infectives: Anti-infectives (From admission, onward)   Start     Dose/Rate Route Frequency Ordered Stop   02/04/21 0900  ceFAZolin (ANCEF) IVPB 2g/100 mL premix       "And" Linked  Group Details   2 g 200 mL/hr over 30 Minutes Intravenous On call to O.R. 02/04/21 0845 02/04/21 1038   02/04/21 0900  metroNIDAZOLE (FLAGYL) IVPB 500 mg       "And" Linked Group Details   500 mg 100 mL/hr over 60 Minutes Intravenous On call to O.R. 02/04/21 0845 02/04/21 1055       Assessment/Plan 45 yo male with gastric GIST, POD1 s/p laparoscopic gastric wedge resection. - Advance to full liquid diet - SLIV when tolerating PO - Pain control: scheduled tylenol, will add scheduled toradol. PRN oxycodone for more severe pain. - Mobilize today - Home meds - Patient does self I&O caths - VTE: lovenox, SCDs - Dispo: inpatient, med-surg   LOS: 1 day    Michaelle Birks, MD [facility] General, Hepatobiliary and Pancreatic Surgery 02/05/21 7:19 AM

## 2021-02-06 MED ORDER — OXYCODONE HCL 5 MG PO TABS: 5.0000 mg | ORAL_TABLET | Freq: Four times a day (QID) | ORAL | 0 refills | Status: DC | PRN

## 2021-02-06 MED ORDER — ACETAMINOPHEN 500 MG PO TABS: 1000.0000 mg | ORAL_TABLET | Freq: Three times a day (TID) | ORAL | 0 refills | Status: AC | PRN

## 2021-02-06 NOTE — Discharge Summary (Addendum)
Physician Discharge Summary  Patient ID: Darryl Moran MRN: [id number] DOB/AGE: 43/28/1977 43 y.o.  Admit date: 02/04/2021 Discharge date: 02/06/2021  Admission Diagnoses: Gastric GIST  Discharge Diagnoses:  Active Problems:   Gastrointestinal stromal tumor (GIST) of stomach [facility])   Discharged Condition: good  Hospital Course: Darryl Moran is a 45 yo male who was referred with a newly-diagnosed GIST on the greater curve of the stomach. He was taken to the operating room on 4/25 for a laparoscopic gastric wedge resection. For further details of the procedure please see separately dictated operative note. Postoperatively he was admitted to the med-surg floor in stable condition. He was started on a clear liquid diet on POD0, which he tolerated. He was advanced to a full liquid diet on POD1, which he tolerated with no nausea or vomiting. He mobilized without difficulty. He was given a soft diet on POD2. He was ambulating and pain was controlled with oral medications. He was examined and deemed appropriate for discharge home, with outpatient follow up in 3 weeks. His pathology results are pending at the time of discharge.  Consults: None  Significant Diagnostic Studies: N/A  Treatments: surgery: laparoscopic gastric wedge resection  Discharge Exam: Blood pressure 130/85, pulse 99, temperature 97.7 F (36.5 C), temperature source Oral, resp. rate 18, height 5\' 3"  (1.6 m), weight 61.2 kg, SpO2 98 %. General appearance: alert, cooperative and appears stated age Eyes: no scleral icterus Neck: trachea midline Resp: normal work of breathing on room air GI: soft, nontender, nondistended Neurologic: Grossly normal Incision/Wound: abdominal incisions clean and dry, mild ecchymosis but no induration or drainage  Disposition: Discharge disposition: 01-Home or Self Care       Discharge Instructions    Call MD for:  persistant nausea and vomiting   Complete by: As directed    Call MD for:   redness, tenderness, or signs of infection (pain, swelling, redness, odor or green/yellow discharge around incision site)   Complete by: As directed    Call MD for:  severe uncontrolled pain   Complete by: As directed    Call MD for:  temperature >100.4   Complete by: As directed    Increase activity slowly   Complete by: As directed    Lifting restrictions   Complete by: As directed    10 pounds for 4 weeks     Allergies as of 02/06/2021      Reactions   Latex Anaphylaxis   Amoxicillin Rash      Medication List    TAKE these medications   acetaminophen 500 MG tablet Commonly known as: TYLENOL Take 2 tablets (1,000 mg total) by mouth every 8 (eight) hours as needed for mild pain.   Calcium Carbonate-Vitamin D 600-200 MG-UNIT Tabs Take 1 tablet by mouth in the morning.   imipramine 50 MG tablet Commonly known as: TOFRANIL Take 50 mg by mouth at bedtime.   levETIRAcetam 500 MG tablet Commonly known as: KEPPRA Take 250-500 mg by mouth See admin instructions. Take 0.5 tablet (250 mg) by mouth in the morning & take 1 tablet (500 mg) by mouth at night.   multivitamin with minerals Tabs tablet Take 1 tablet by mouth in the morning.   omeprazole 20 MG capsule Commonly known as: PRILOSEC Take 1 capsule (20 mg total) by mouth 2 (two) times daily before a meal. What changed: when to take this   oxybutynin 15 MG 24 hr tablet Commonly known as: DITROPAN XL Take 15 mg by mouth in the  morning.   oxybutynin 5 MG tablet Commonly known as: DITROPAN Take 5 mg by mouth at bedtime.   oxyCODONE 5 MG immediate release tablet Commonly known as: Oxy IR/ROXICODONE Take 1 tablet (5 mg total) by mouth every 6 (six) hours as needed for severe pain.   PROBIOTIC ADVANCED PO Take 1 capsule by mouth in the morning.   sertraline 100 MG tablet Commonly known as: ZOLOFT Take 100 mg by mouth at bedtime.   vitamin C 500 MG tablet Commonly known as: ASCORBIC ACID Take 500 mg by mouth in the  morning.   Vitamin D3 50 MCG (2000 UT) Tabs Take 2,000 Units by mouth in the morning.         Signed: Dwan Bolt 02/06/2021, 11:03 AM

## 2021-02-06 NOTE — Discharge Instructions (Signed)
[facility] DISCHARGE INSTRUCTIONS  Activity . No heavy lifting greater than 10 pounds for 4 weeks after surgery. Madaline Brilliant to shower, but do not bathe or submerge incisions underwater. . Do not drive while taking narcotic pain medication.  Wound Care . Your incisions are covered with skin glue called Dermabond. This will peel off on its own over time. . You may shower and allow warm soapy water to run over your incisions. Gently pat dry. . Do not submerge your incision underwater. . Monitor your incision for any new redness, tenderness, or drainage.  When to Call us: Marland Kitchen Fever greater than 100.5 . New redness, drainage, or swelling at incision site . Severe pain, nausea, or vomiting . Jaundice (yellowing of the whites of the eyes or skin)  Follow-up You have an appointment scheduled with Dr. [name] on Feb 26, 2021 at 10:45am. This will be at the [facility] office at [address], [place], [place], [place]. Please arrive at least 15 minutes prior to your scheduled appointment time.  For questions or concerns, please call the office at [phone].    Soft-Food Eating Plan A soft-food eating plan includes foods that are safe and easy to chew and swallow. Your health care provider or dietitian can help you find foods and flavors that fit into this plan. Follow this plan until your health care provider or dietitian says it is safe to start eating other foods and food textures. What are tips for following this plan? General guidelines  Take small bites of food, or cut food into pieces about  inch or smaller. Bite-sized pieces of food are easier to chew and swallow.  Eat moist foods. Avoid overly dry foods.  Avoid foods that: ? Are difficult to swallow, such as dry, chunky, crispy, or sticky foods. ? Are difficult to chew, such as hard, tough, or stringy foods. ? Contain nuts, seeds, or fruits.  Follow instructions from your dietitian about the types of  liquids that are safe for you to swallow. You may be allowed to have: ? Thick liquids only. This includes only liquids that are thicker than honey. ? Thin and thick liquids. This includes all beverages and foods that become liquid at room temperature.  To make thick liquids: ? Purchase a commercial liquid thickening powder. These are available at grocery stores and pharmacies. ? Mix the thickener into liquids according to instructions on the label. ? Purchase ready-made thickened liquids. ? Thicken soup by pureeing, straining to remove chunks, and adding flour, potato flakes, or corn starch. ? Add commercial thickener to foods that become liquid at room temperature, such as milk shakes, yogurt, ice cream, gelatin, and sherbet.  Ask your health care provider whether you need to take a fiber supplement.   Cooking  Cook meats so they stay tender and moist. Use methods like braising, stewing, or baking in liquid.  Cook vegetables and fruit until they are soft enough to be mashed with a fork.  Peel soft, fresh fruits such as peaches, nectarines, and melons.  When making soup, make sure chunks of meat and vegetables are smaller than  inch.  Reheat leftover foods slowly so that a tough crust does not form. What foods are allowed? The items listed below may not be a complete list. Talk with your dietitian about what dietary choices are best for you. Grains Breads, muffins, pancakes, or waffles moistened with syrup, jelly, or butter. Dry cereals well-moistened with milk. Moist, cooked cereals. Well-cooked  pasta and rice. Vegetables All soft-cooked vegetables. Shredded lettuce. Fruits All canned and cooked fruits. Soft, peeled fresh fruits. Strawberries. Dairy Milk. Cream. Yogurt. Cottage cheese. Soft cheese without the rind. Meats and other protein foods Tender, moist ground meat, poultry, or fish. Meat cooked in gravy or sauces. Eggs. Sweets and desserts Ice cream. Milk shakes. Sherbet.  Pudding. Fats and oils Butter. Margarine. Olive, canola, sunflower, and grapeseed oil. Smooth salad dressing. Smooth cream cheese. Mayonnaise. Gravy. What foods are not allowed? The items listed bemay not be a complete list. Talk with your dietitian about what dietary choices are best for you. Grains Coarse or dry cereals, such as bran, granola, and shredded wheat. Tough or chewy crusty breads, such as [place] bread or baguettes. Breads with nuts, seeds, or fruit. Vegetables All raw vegetables. Cooked corn. Cooked vegetables that are tough or stringy. Tough, crisp, fried potatoes and potato skins. Fruits Fresh fruits with skins or seeds, or both, such as apples, pears, and grapes. Stringy, high-pulp fruits, such as papaya, pineapple, coconut, and mango. Fruit leather and all dried fruit. Dairy Yogurt with nuts or coconut. Meats and other protein foods Hard, dry sausages. Dry meat, poultry, or fish. Meats with gristle. Fish with bones. Fried meat or fish. Lunch meat and hotdogs. Nuts and seeds. Chunky peanut butter or other nut butters. Sweets and desserts Cakes or cookies that are very dry or chewy. Desserts with dried fruit, nuts, or coconut. Fried pastries. Very rich pastries. Fats and oils Cream cheese with fruit or nuts. Salad dressings with seeds or chunks. Summary  A soft-food eating plan includes foods that are safe and easy to swallow. Generally, the foods should be soft enough to be mashed with a fork.  Avoid foods that are dry, hard to chew, crunchy, sticky, stringy, or crispy.  Ask your health care provider whether you need to thicken your liquids and if you need to take a fiber supplement. This information is not intended to replace advice given to you by your health care provider. Make sure you discuss any questions you have with your health care provider. Document Revised: 01/20/2019 Document Reviewed: 12/02/2016 Elsevier Patient Education  2021 [employer].

## 2021-02-13 ENCOUNTER — Other Ambulatory Visit: Payer: Self-pay

## 2021-02-13 NOTE — Progress Notes (Signed)
Spoke to patient's father Gwyndolyn Saxon regarding out Tumor Board discussion this morning and based on his pathology report Dr. Burr Medico would like to bring him in to see her in a couple of weeks.  I have scheduled him for Wednesday 5/18 at 12:00.  He agreed with this appointment.

## 2021-02-13 NOTE — Progress Notes (Signed)
The proposed treatment discussed in conference is for discussion purposes only and is not a binding recommendation.  The patients have not been physically examined, or presented with their treatment options.  Therefore, final treatment plans cannot be decided.   

## 2021-02-18 NOTE — Progress Notes (Signed)
Patient's mother calls to let us know that his father tested positive for Covid, she is wanting to reschedule his current appointment for 5/18 to two weeks out.  I have rescheduled him for Friday 6/3 at 11:20 to arrive 15 minutes prior.  I have also encouraged her and the patient to get testing. She verbalized an understanding.

## 2021-02-26 ENCOUNTER — Encounter (HOSPITAL_COMMUNITY): Payer: Self-pay | Admitting: Hematology

## 2021-02-27 ENCOUNTER — Ambulatory Visit: Payer: Medicare Other | Admitting: Hematology

## 2021-03-01 ENCOUNTER — Encounter (HOSPITAL_COMMUNITY): Payer: Self-pay

## 2021-03-12 ENCOUNTER — Encounter (HOSPITAL_COMMUNITY): Payer: Self-pay | Admitting: Hematology

## 2021-03-13 NOTE — Progress Notes (Signed)
[place]   [phone] Fax:([phone]) [phone]   Clinic Follow up Note   Patient Care Team: Ludwig Clarks, FNP as PCP - General (Family Medicine) Jonnie Finner, RN as Oncology Nurse Navigator Truitt Merle, MD as Consulting Physician (Oncology) Dwan Bolt, MD as Consulting Physician (General Surgery) Mansouraty, Telford Nab., MD as Consulting Physician (Gastroenterology)  Date of Service:  03/15/2021  CHIEF COMPLAINT: Newly Diagnosed gastric GIST  SUMMARY OF ONCOLOGIC HISTORY: Oncology History Overview Note  Cancer Staging Gastrointestinal stromal tumor (GIST) of stomach ([place]) Staging form: Gastrointestinal Stromal Tumor - Gastric and Omental GIST, AJCC 8th Edition - Clinical stage from 01/10/2021: Stage IA (cT2, cN0, cM0, Mitotic Rate: Low) - Signed by Truitt Merle, MD on 01/23/2021 Stage prefix: Initial diagnosis Histologic grade (G): Low grade Histologic grading system: 2 grade system    Gastrointestinal stromal tumor (GIST) of stomach ([place])  11/27/2020 Imaging   CT Angio 11/27/20  IMPRESSION: 1. No acute arterial abnormality detected. However, evaluation below the knees was somewhat limited by contrast timing. 2. Cholelithiasis without acute inflammation. 3. Distended urinary bladder with some mild bladder wall thickening. Correlate with urinalysis. Findings are suggestive of a neurogenic bladder. 4. Exophytic masslike area arising from the greater curvature of the gastric body. This is concerning for a malignancy such as a GIST. Outpatient GI follow-up is recommended.   12/04/2020 Procedure   Upper Endoscopy by Dr Abbey Chatters 12/04/20  IMPRESSION - Rule out malignancy, gastric tumor on the greater curvature of the stomach. Biopsied. - Gastritis. Biopsied. - Normal duodenal bulb, first portion of the duodenum and second portion of the duodenum.  FINAL MICROSCOPIC DIAGNOSIS: 12/04/20 A. STOMACH, MASS, BIOPSY:  - Gastric oxyntic mucosa with mild  chronic gastritis  - Negative for intestinal metaplasia, dysplasia or malignancy  - No submucosa is present for evaluation   B. STOMACH, ANTRUM AND BODY, BIOPSY:  - Gastric antral and oxyntic mucosa with mild chronic gastritis  - Gastric oxyntic mucosa with parietal cell hyperplasia as can be seen  in hypergastrinemic states such as PPI therapy.  - Warthin Starry stain is negative for Helicobacter pylori    01/10/2021 Procedure   Upper EUS by Dr Rush Landmark 01/10/21 EGD Impression: - No gross lesions in esophagus. Z-line regular, 36 cm from the incisors. - Gastritis. Biopsied. - Rule out malignancy, gastric subepithelial lesion on the greater curvature of the stomach was noted. - No gross lesions in the duodenal bulb, in the first portion of the duodenum and in the second portion of the duodenum. EUS Impression: - An intramural (subepithelial) lesion was found in the greater curve of the stomach. The lesion appeared to originate from within the muscularis propria (Layer 4). Cytology results are pending. However, the endosonographic appearance is highly suspicious for a stromal cell (smooth muscle) neoplasm. Fine needle biopsy performed. - No malignant-appearing lymph nodes were visualized in the celiac region (level 20), perigastric region and peripancreatic region.    FINAL MICROSCOPIC DIAGNOSIS: 01/10/21 A. STOMACH, BIOPSY:  - Gastric antral and oxyntic mucosa with slight chronic inflammation.  - Warthin-Starry negative for Helicobacter pylori.  - No intestinal metaplasia, dysplasia, GIST or carcinoma.     01/10/2021 Initial Biopsy   Initial Biopsy 01/10/21 FINAL MICROSCOPIC DIAGNOSIS:  A. STOMACH, FINE NEEDLE ASPIRATION:  - Neoplastic cells consistent with gastrointestinal stromal tumor  (GIST).  - See comment.   COMMENT:  The neoplastic cells have epithelioid morphology.  Immunohistochemistry  shows strong positivity with CD117 (c-kit) and CD34.  The  neoplastic  cells are  negative with cytokeratin AE1/AE3, CD56, chromogranin and  synaptophysin.  Immunohistochemistry with Ki-67 shows a low  proliferation rate.  The findings are consistent with gastrointestinal  stromal tumor (GIST) with epithelioid features.    01/10/2021 Cancer Staging   Staging form: Gastrointestinal Stromal Tumor - Gastric and Omental GIST, AJCC 8th Edition - Clinical stage from 01/10/2021: Stage IA (cT2, cN0, cM0, Mitotic Rate: Low) - Signed by Truitt Merle, MD on 01/23/2021 Stage prefix: Initial diagnosis Histologic grade (G): Low grade Histologic grading system: 2 grade system   01/22/2021 Imaging   CT Chest  IMPRESSION: 1. No pulmonary metastasis. 2. No mediastinal nodal metastasis. 3. Partially exophytic submucosal lesion extending from the greater curvature measures up to 3.5 cm. No upper abdominal adenopathy.     01/23/2021 Initial Diagnosis   Gastrointestinal stromal tumor (GIST) of stomach ([place])   02/04/2021 Surgery   LAPAROSCOPIC PARTIAL GASTRECTOMY by Dr [name]   02/04/2021 Pathology Results   FINAL MICROSCOPIC DIAGNOSIS:   A. STOMACH MASS, PARTIAL GASTRECTOMY:  -  Gastrointestinal stromal tumor, 3.5 cm  -  Tumor extends to the cauterized resection margins  -  See oncology table and comment below   B. GASTRIC MARGIN, EXCISION:  -  No residual tumor identified     PDGFRa mutations no detected  KIT (c-KIT) Mutations:  NOT DECTECTED       CURRENT THERAPY:  Surveillance   INTERVAL HISTORY:  Darryl Moran is here for a follow up of GIST. He was last seen by me 2 months ago. He presents to the clinic with his mother and father. He notes some right upper abdominal discomfort right after surgery which is now resolved. He notes he is eating well and weight is stable. He has not returned to Dr [name] yet. He does not eat large meals but eats fruits and fast foods.    REVIEW OF SYSTEMS:   Constitutional: Denies fevers, chills or abnormal weight loss Eyes: Denies blurriness  of vision Ears, nose, mouth, throat, and face: Denies mucositis or sore throat Respiratory: Denies cough, dyspnea or wheezes Cardiovascular: Denies palpitation, chest discomfort or lower extremity swelling Gastrointestinal:  Denies nausea, heartburn or change in bowel habits Skin: Denies abnormal skin rashes Lymphatics: Denies new lymphadenopathy or easy bruising Neurological:Denies numbness, tingling or new weaknesses Behavioral/Psych: Mood is stable, no new changes  All other systems were reviewed with the patient and are negative.  MEDICAL HISTORY:  Past Medical History:  Diagnosis Date  . GERD (gastroesophageal reflux disease)   . GERD (gastroesophageal reflux disease)   . Seizure ([place])    unknown etiology and on keppra now. last seizure was 5 years ago as of 11/30/2020.  Marland Kitchen Spina bifida [facility])     SURGICAL HISTORY: Past Surgical History:  Procedure Laterality Date  . BIOPSY  12/04/2020   Procedure: BIOPSY;  Surgeon: [name], DO;  [facility];  Service: Endoscopy;;  . BIOPSY  01/10/2021   Procedure: BIOPSY;  Surgeon: Irving Copas., MD;  Location: [facility];  Service: Gastroenterology;;  . ESOPHAGOGASTRODUODENOSCOPY (EGD) WITH PROPOFOL N/A 12/04/2020   Procedure: ESOPHAGOGASTRODUODENOSCOPY (EGD) WITH PROPOFOL;  Surgeon: [name], DO;  [facility];  Service: Endoscopy;  Laterality: N/A;  9:00am  . ESOPHAGOGASTRODUODENOSCOPY (EGD) WITH PROPOFOL N/A 01/10/2021   Procedure: ESOPHAGOGASTRODUODENOSCOPY (EGD) WITH PROPOFOL;  Surgeon: Rush Landmark Telford Nab., MD;  Location: [facility];  Service: Gastroenterology;  Laterality: N/A;  . EUS N/A 01/10/2021   Procedure: UPPER  ENDOSCOPIC ULTRASOUND (EUS) RADIAL;  Surgeon: Rush Landmark Telford Nab., MD;  Location: [facility];  Service: Gastroenterology;  Laterality: N/A;  . FINE NEEDLE ASPIRATION  01/10/2021   Procedure: FINE NEEDLE ASPIRATION (FNA) LINEAR;  Surgeon: Irving Copas., MD;   Location: [facility];  Service: Gastroenterology;;  . FRACTURE SURGERY Left    metal plate placed [facility] leg [facility] for stabilization per mother 12/30/20  . LAPAROSCOPIC PARTIAL GASTRECTOMY N/A 02/04/2021   Procedure: LAPAROSCOPIC PARTIAL GASTRECTOMY;  Surgeon: Dwan Bolt, MD;  Location: [facility];  Service: General;  Laterality: N/A;  2 HRS  . SHUNT REPLACEMENT    . SHUNT REVISION    . SPINE SURGERY      I have reviewed the social history and family history with the patient and they are unchanged from previous note.  ALLERGIES:  is allergic to latex and amoxicillin.  MEDICATIONS:  Current Outpatient Medications  Medication Sig Dispense Refill  . acetaminophen (TYLENOL) 500 MG tablet Take 2 tablets (1,000 mg total) by mouth every 8 (eight) hours as needed for mild pain. 30 tablet 0  . Calcium Carbonate-Vitamin D 600-200 MG-UNIT TABS Take 1 tablet by mouth in the morning.    . Cholecalciferol (VITAMIN D3) 50 MCG (2000 UT) TABS Take 2,000 Units by mouth in the morning.    Marland Kitchen imipramine (TOFRANIL) 50 MG tablet Take 50 mg by mouth at bedtime.    . levETIRAcetam (KEPPRA) 500 MG tablet Take 250-500 mg by mouth See admin instructions. Take 0.5 tablet (250 mg) by mouth in the morning & take 1 tablet (500 mg) by mouth at night.    . Multiple Vitamin (MULTIVITAMIN WITH MINERALS) TABS tablet Take 1 tablet by mouth in the morning.    Marland Kitchen omeprazole (PRILOSEC) 20 MG capsule Take 1 capsule (20 mg total) by mouth 2 (two) times daily before a meal. (Patient taking differently: Take 20 mg by mouth in the morning and at bedtime.) 60 capsule 5  . oxybutynin (DITROPAN XL) 15 MG 24 hr tablet Take 15 mg by mouth in the morning.    Marland Kitchen oxybutynin (DITROPAN) 5 MG tablet Take 5 mg by mouth at bedtime.    Marland Kitchen oxyCODONE (OXY IR/ROXICODONE) 5 MG immediate release tablet Take 1 tablet (5 mg total) by mouth every 6 (six) hours as needed for severe pain. 15 tablet 0  . Probiotic Product (PROBIOTIC ADVANCED PO) Take 1  capsule by mouth in the morning.    . sertraline (ZOLOFT) 100 MG tablet Take 100 mg by mouth at bedtime.    . vitamin C (ASCORBIC ACID) 500 MG tablet Take 500 mg by mouth in the morning.     No current facility-administered medications for this visit.    PHYSICAL EXAMINATION: ECOG PERFORMANCE STATUS: 0 - Asymptomatic  Vitals:   03/15/21 1124  BP: 111/85  Pulse: (!) 108  Resp: 14  Temp: 97.9 F (36.6 C)  SpO2: 99%   Filed Weights   03/15/21 1124  Weight: 134 lb 12.8 oz (61.1 kg)    GENERAL:alert, no distress and comfortable SKIN: skin color, texture, turgor are normal, no rashes or significant lesions EYES: normal, Conjunctiva are pink and non-injected, sclera clear  NECK: supple, thyroid normal size, non-tender, without nodularity LYMPH:  no palpable lymphadenopathy in the cervical, axillary  LUNGS: clear to auscultation and percussion with normal breathing effort HEART: regular rate & rhythm and no murmurs and no lower extremity edema ABDOMEN:abdomen soft, non-tender and normal bowel sounds (+) abdominal lapscopic surgical  incision healed well.  Musculoskeletal:no cyanosis of digits and no clubbing  NEURO: alert & oriented x 3 with fluent speech, no focal motor/sensory deficits  LABORATORY DATA:  I have reviewed the data as listed CBC Latest Ref Rng & Units 02/05/2021 02/04/2021 11/27/2020  WBC 4.0 - 10.5 K/uL 11.8(H) 7.8 7.4  Hemoglobin 13.0 - 17.0 g/dL 12.4(L) 14.8 15.0  Hematocrit 39.0 - 52.0 % 37.0(L) 45.5 44.6  Platelets 150 - 400 K/uL 264 283 258     CMP Latest Ref Rng & Units 02/05/2021 02/04/2021 11/27/2020  Glucose 70 - 99 mg/dL 108(H) 87 104(H)  BUN 6 - 20 mg/dL _0 Creatinine 0.61 - 1.24 mg/dL 0.67 0.61 0.78  Sodium 135 - 145 mmol/L 137 138 138  Potassium 3.5 - 5.1 mmol/L 4.3 3.8 3.4(L)  Chloride 98 - 111 mmol/L 102 100 102  CO2 22 - 32 mmol/L _1 Calcium 8.9 - 10.3 mg/dL 8.9 9.5 9.3      RADIOGRAPHIC STUDIES: I have personally reviewed the  radiological images as listed and agreed with the findings in the report. No results found.   ASSESSMENT & PLAN:  Darryl Moran is a 45 y.o. male with   1. Gastrointestinal stromal Tumor, pT2N0M0 stage IA -I reviewed image findings and pathology report with patient and parents in great detail.  -during his work up of his LE vascular issues, CT scan showed mass in stomach on 11/27/20  -His initial endoscopy was insufficient. His repeat Endoscopy with Dr Rush Landmark on 01/10/21 showed an submucosal mass, biopsy showed neoplastic cells consistent with GIST.  -His CT chest from 01/22/21 showed know gastric mass, but no other thoracic concerns. -He underwent surgical resection with Dr [name] on 02/04/21. His surgical path showed 3.5cm GIST tumor was completely removed, clear margins and mitotic rate of 6. Given size of tumor and elevated mitotic rate, he has moderate risk of metastasis at 16% in the future. I personally reviewed this with patient and parents today.  -His pathology also showed KIT mutation was not detected. This indicates less benefit from target therapy with Gleevec to reduce risk of future metastasis. His PDGFRa mutation was also not detected.  -Also adjuvant Imatinib is usually recommended for intermediate risk disease, I do not recommend Gleevec, given little benefit of Imatinib due to lack of Kit mutation . I do recommend close surveillance with physical exam and lab test every 3-4 months for the first 2 years, then every 6-12 months, and surveillance CT scan every 12-24 months for up to 5 years.  -Will monitor his labs for nutritional deficiencies related anemia, s/p gastric surgery.  -F/u in 4 months.   2. 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  -His Spina Bifida causes vascular issues of his LE. He has low pedal pulse and circulation with no sensation below ankle. He wears feet braces. His feet intermittent turn black  and purple.  -He will continue his medications and f/u with other physicians.  -On Prilosec for GERD.  -He has recent constipation/diarrhea which leads to vomiting from what he ate. He manages on fiber bars and enemas as needed.  -He has shunt in place in abdomen.    3. Social support  -He lives with both his parents who are his primary care givers.  -He ambulates with crutches outside and uses wheelchair at home -He is able to take care of ADL overall independently. His parents cook for  him.    PLAN:  -I reviewed his surgical pathology and [place] testing results  -I do not recommend adjuvant imatinib due to negative cKIT mutation  -lab and f/u in 4 months    No problem-specific Assessment & Plan notes found for this encounter.   No orders of the defined types were placed in this encounter.  All questions were answered. The patient knows to call the clinic with any problems, questions or concerns. No barriers to learning was detected. The total time spent in the appointment was 30 minutes.     Truitt Merle, MD 03/15/2021   I, Joslyn Devon, am acting as scribe for Truitt Merle, MD.   I have reviewed the above documentation for accuracy and completeness, and I agree with the above.

## 2021-03-14 LAB — SURGICAL PATHOLOGY

## 2021-03-15 ENCOUNTER — Other Ambulatory Visit: Payer: Self-pay

## 2021-03-15 ENCOUNTER — Encounter: Payer: Self-pay | Admitting: Hematology

## 2021-03-15 ENCOUNTER — Inpatient Hospital Stay: Payer: Medicare Other | Attending: Hematology | Admitting: Hematology

## 2021-03-15 VITALS — BP 111/85 | HR 108 | Temp 97.9°F | Resp 14 | Ht 63.0 in | Wt 134.8 lb

## 2021-03-15 DIAGNOSIS — C49A2 Gastrointestinal stromal tumor of stomach: Secondary | ICD-10-CM | POA: Diagnosis present

## 2021-03-15 DIAGNOSIS — Q059 Spina bifida, unspecified: Secondary | ICD-10-CM | POA: Diagnosis not present

## 2021-03-15 DIAGNOSIS — K219 Gastro-esophageal reflux disease without esophagitis: Secondary | ICD-10-CM | POA: Diagnosis not present

## 2021-03-15 DIAGNOSIS — Z79899 Other long term (current) drug therapy: Secondary | ICD-10-CM | POA: Diagnosis not present

## 2021-04-22 ENCOUNTER — Ambulatory Visit: Payer: Medicare Other | Admitting: Hematology

## 2021-07-12 ENCOUNTER — Other Ambulatory Visit: Payer: Self-pay | Admitting: Hematology

## 2021-07-12 DIAGNOSIS — C49A2 Gastrointestinal stromal tumor of stomach: Secondary | ICD-10-CM

## 2021-07-15 ENCOUNTER — Inpatient Hospital Stay: Payer: Medicare Other | Attending: Hematology | Admitting: Hematology

## 2021-07-15 ENCOUNTER — Other Ambulatory Visit: Payer: Self-pay

## 2021-07-15 ENCOUNTER — Inpatient Hospital Stay: Payer: Medicare Other

## 2021-07-15 ENCOUNTER — Encounter: Payer: Self-pay | Admitting: Hematology

## 2021-07-15 VITALS — BP 141/100 | HR 108 | Temp 98.2°F | Resp 19 | Ht 63.0 in | Wt 138.0 lb

## 2021-07-15 DIAGNOSIS — Z85028 Personal history of other malignant neoplasm of stomach: Secondary | ICD-10-CM | POA: Insufficient documentation

## 2021-07-15 DIAGNOSIS — C49A2 Gastrointestinal stromal tumor of stomach: Secondary | ICD-10-CM

## 2021-07-15 LAB — COMPREHENSIVE METABOLIC PANEL
ALT: 22 U/L (ref 0–44)
AST: 24 U/L (ref 15–41)
Albumin: 4.2 g/dL (ref 3.5–5.0)
Alkaline Phosphatase: 104 U/L (ref 38–126)
Anion gap: 10 (ref 5–15)
BUN: 15 mg/dL (ref 6–20)
CO2: 30 mmol/L (ref 22–32)
Calcium: 9.9 mg/dL (ref 8.9–10.3)
Chloride: 102 mmol/L (ref 98–111)
Creatinine, Ser: 0.84 mg/dL (ref 0.61–1.24)
GFR, Estimated: >60 mL/min (ref >60)
Glucose, Bld: 112 mg/dL — ABNORMAL HIGH (ref 70–99)
Potassium: 3.9 mmol/L (ref 3.5–5.1)
Sodium: 142 mmol/L (ref 135–145)
Total Bilirubin: 0.4 mg/dL (ref 0.3–1.2)
Total Protein: 7.8 g/dL (ref 6.5–8.1)

## 2021-07-15 LAB — CBC WITH DIFFERENTIAL/PLATELET
Abs Immature Granulocytes: 0.01 10*3/uL (ref 0.00–0.07)
Basophils Absolute: 0 10*3/uL (ref 0.0–0.1)
Basophils Relative: 1 %
Eosinophils Absolute: 0.4 10*3/uL (ref 0.0–0.5)
Eosinophils Relative: 8 %
HCT: 40.9 % (ref 39.0–52.0)
Hemoglobin: 13.8 g/dL (ref 13.0–17.0)
Immature Granulocytes: 0 %
Lymphocytes Relative: 30 %
Lymphs Abs: 1.5 10*3/uL (ref 0.7–4.0)
MCH: 29.7 pg (ref 26.0–34.0)
MCHC: 33.7 g/dL (ref 30.0–36.0)
MCV: 88.1 fL (ref 80.0–100.0)
Monocytes Absolute: 0.5 10*3/uL (ref 0.1–1.0)
Monocytes Relative: 9 %
Neutro Abs: 2.6 10*3/uL (ref 1.7–7.7)
Neutrophils Relative %: 52 %
Platelets: 224 10*3/uL (ref 150–400)
RBC: 4.64 MIL/uL (ref 4.22–5.81)
RDW: 13.3 % (ref 11.5–15.5)
WBC: 5.1 10*3/uL (ref 4.0–10.5)
nRBC: 0 % (ref 0.0–0.2)

## 2021-07-15 NOTE — Progress Notes (Signed)
[place]   [phone] Fax:([phone]   Clinic Follow up Note   Patient Care Team: Ludwig Clarks, FNP as PCP - General (Family Medicine) Jonnie Finner, RN (Inactive) as Oncology Nurse Navigator Truitt Merle, MD as Consulting Physician (Oncology) Dwan Bolt, MD as Consulting Physician (General Surgery) Mansouraty, Telford Nab., MD as Consulting Physician (Gastroenterology)  Date of Service:  07/15/2021  CHIEF COMPLAINT: f/u of GIST  CURRENT THERAPY:  Surveillance  ASSESSMENT & PLAN:  Darryl Moran is a 45 y.o. male with   1. Gastrointestinal stromal Tumor, pT2N0M0 stage IA -during work up of his LE vascular issues, CT scan showed mass in stomach on 11/27/20  -His initial endoscopy was insufficient. His repeat Endoscopy with Dr Rush Landmark on 01/10/21 showed an submucosal mass, biopsy showed neoplastic cells consistent with GIST.  -His CT chest from 01/22/21 showed know gastric mass, but no other thoracic concerns. -He underwent surgical resection with Dr [name] on 02/04/21. His surgical path showed 3.5cm GIST tumor was completely removed, clear margins and mitotic rate of 6. Given size of tumor and elevated mitotic rate, he has moderate risk of metastasis at 16% in the future.  -His pathology also showed KIT mutation was not detected. This indicates less benefit from target therapy with Gleevec to reduce risk of future metastasis. His PDGFRa mutation was also not detected.  -He is under surveillance with physical exam and lab test every 3-4 months for the first 2 years, then every 6-12 months, and surveillance CT scan every 12-24 months for up to 5 years.  -Will monitor his labs for nutritional deficiencies related anemia, s/p gastric surgery.  -F/u in 6 months, with lab and CT several days before.   2. 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  -His Spina Bifida causes vascular issues  of his LE. He has low pedal pulse and circulation with no sensation below ankle. He wears feet braces. His feet intermittent turn black and purple.  -He will continue his medications and f/u with other physicians.  -On Prilosec for GERD.  -He has recent constipation/diarrhea which leads to vomiting from what he ate. He manages on fiber bars and enemas as needed.  -He has shunt in place in abdomen.    3. Social support  -He lives with both his parents who are his primary care givers.  -He ambulates with crutches outside and uses wheelchair at home -He is able to take care of ADL overall independently. His parents cook for him.      PLAN:  -lab and CT in 6 months, with f/u several days later.   No problem-specific Assessment & Plan notes found for this encounter.   SUMMARY OF ONCOLOGIC HISTORY: Oncology History Overview Note  Cancer Staging Gastrointestinal stromal tumor (GIST) of stomach (HCC) Staging form: Gastrointestinal Stromal Tumor - Gastric and Omental GIST, AJCC 8th Edition - Clinical stage from 01/10/2021: Stage IA (cT2, cN0, cM0, Mitotic Rate: Low) - Signed by Truitt Merle, MD on 01/23/2021 Stage prefix: Initial diagnosis Histologic grade (G): Low grade Histologic grading system: 2 grade system    Gastrointestinal stromal tumor (GIST) of stomach ([place])  11/27/2020 Imaging   CT Angio 11/27/20  IMPRESSION: 1. No acute arterial abnormality detected. However, evaluation below the knees was somewhat limited by contrast timing. 2. Cholelithiasis without acute inflammation. 3. Distended urinary bladder with some mild bladder wall thickening. Correlate with urinalysis. Findings are suggestive of a  neurogenic bladder. 4. Exophytic masslike area arising from the greater curvature of the gastric body. This is concerning for a malignancy such as a GIST. Outpatient GI follow-up is recommended.   12/04/2020 Procedure   Upper Endoscopy by Dr Abbey Chatters 12/04/20  IMPRESSION - Rule out  malignancy, gastric tumor on the greater curvature of the stomach. Biopsied. - Gastritis. Biopsied. - Normal duodenal bulb, first portion of the duodenum and second portion of the duodenum.  FINAL MICROSCOPIC DIAGNOSIS: 12/04/20 A. STOMACH, MASS, BIOPSY:  - Gastric oxyntic mucosa with mild chronic gastritis  - Negative for intestinal metaplasia, dysplasia or malignancy  - No submucosa is present for evaluation   B. STOMACH, ANTRUM AND BODY, BIOPSY:  - Gastric antral and oxyntic mucosa with mild chronic gastritis  - Gastric oxyntic mucosa with parietal cell hyperplasia as can be seen  in hypergastrinemic states such as PPI therapy.  - Warthin Starry stain is negative for Helicobacter pylori    01/10/2021 Procedure   Upper EUS by Dr Rush Landmark 01/10/21 EGD Impression: - No gross lesions in esophagus. Z-line regular, 36 cm from the incisors. - Gastritis. Biopsied. - Rule out malignancy, gastric subepithelial lesion on the greater curvature of the stomach was noted. - No gross lesions in the duodenal bulb, in the first portion of the duodenum and in the second portion of the duodenum. EUS Impression: - An intramural (subepithelial) lesion was found in the greater curve of the stomach. The lesion appeared to originate from within the muscularis propria (Layer 4). Cytology results are pending. However, the endosonographic appearance is highly suspicious for a stromal cell (smooth muscle) neoplasm. Fine needle biopsy performed. - No malignant-appearing lymph nodes were visualized in the celiac region (level 20), perigastric region and peripancreatic region.    FINAL MICROSCOPIC DIAGNOSIS: 01/10/21 A. STOMACH, BIOPSY:  - Gastric antral and oxyntic mucosa with slight chronic inflammation.  - Warthin-Starry negative for Helicobacter pylori.  - No intestinal metaplasia, dysplasia, GIST or carcinoma.     01/10/2021 Initial Biopsy   Initial Biopsy 01/10/21 FINAL MICROSCOPIC DIAGNOSIS:  A.  STOMACH, FINE NEEDLE ASPIRATION:  - Neoplastic cells consistent with gastrointestinal stromal tumor  (GIST).  - See comment.   COMMENT:  The neoplastic cells have epithelioid morphology.  Immunohistochemistry  shows strong positivity with CD117 (c-kit) and CD34.  The neoplastic  cells are negative with cytokeratin AE1/AE3, CD56, chromogranin and  synaptophysin.  Immunohistochemistry with Ki-67 shows a low  proliferation rate.  The findings are consistent with gastrointestinal  stromal tumor (GIST) with epithelioid features.    01/10/2021 Cancer Staging   Staging form: Gastrointestinal Stromal Tumor - Gastric and Omental GIST, AJCC 8th Edition - Clinical stage from 01/10/2021: Stage IA (cT2, cN0, cM0, Mitotic Rate: Low) - Signed by Truitt Merle, MD on 01/23/2021 Stage prefix: Initial diagnosis Histologic grade (G): Low grade Histologic grading system: 2 grade system   01/22/2021 Imaging   CT Chest  IMPRESSION: 1. No pulmonary metastasis. 2. No mediastinal nodal metastasis. 3. Partially exophytic submucosal lesion extending from the greater curvature measures up to 3.5 cm. No upper abdominal adenopathy.     01/23/2021 Initial Diagnosis   Gastrointestinal stromal tumor (GIST) of stomach ([place])   02/04/2021 Surgery   LAPAROSCOPIC PARTIAL GASTRECTOMY by Dr [name]   02/04/2021 Pathology Results   FINAL MICROSCOPIC DIAGNOSIS:   A. STOMACH MASS, PARTIAL GASTRECTOMY:  -  Gastrointestinal stromal tumor, 3.5 cm  -  Tumor extends to the cauterized resection margins  -  See oncology table and comment  below   B. GASTRIC MARGIN, EXCISION:  -  No residual tumor identified     PDGFRa mutations no detected  KIT (c-KIT) Mutations:  NOT DECTECTED       INTERVAL HISTORY:  THEODOR MUSTIN is here for a follow up of GIST. He was last seen by me on 03/15/21. He presents to the clinic accompanied by his mother. He reports he is doing okay overall. He reports he had some stomach pain today that only  lasted a few minutes. He denies constipation.   All other systems were reviewed with the patient and are negative.  MEDICAL HISTORY:  Past Medical History:  Diagnosis Date   GERD (gastroesophageal reflux disease)    GERD (gastroesophageal reflux disease)    Seizure ([place])    unknown etiology and on keppra now. last seizure was 5 years ago as of 11/30/2020.   Spina bifida [facility])     SURGICAL HISTORY: Past Surgical History:  Procedure Laterality Date   BIOPSY  12/04/2020   Procedure: BIOPSY;  Surgeon: [name], DO;  [facility];  Service: Endoscopy;;   BIOPSY  01/10/2021   Procedure: BIOPSY;  Surgeon: Irving Copas., MD;  Location: [place];  Service: Gastroenterology;;   ESOPHAGOGASTRODUODENOSCOPY (EGD) WITH PROPOFOL N/A 12/04/2020   Procedure: ESOPHAGOGASTRODUODENOSCOPY (EGD) WITH PROPOFOL;  Surgeon: [name], DO;  [facility];  Service: Endoscopy;  Laterality: N/A;  9:00am   ESOPHAGOGASTRODUODENOSCOPY (EGD) WITH PROPOFOL N/A 01/10/2021   Procedure: ESOPHAGOGASTRODUODENOSCOPY (EGD) WITH PROPOFOL;  Surgeon: Rush Landmark Telford Nab., MD;  Location: [employer];  Service: Gastroenterology;  Laterality: N/A;   EUS N/A 01/10/2021   Procedure: UPPER ENDOSCOPIC ULTRASOUND (EUS) RADIAL;  Surgeon: Irving Copas., MD;  Location: [place];  Service: Gastroenterology;  Laterality: N/A;   FINE NEEDLE ASPIRATION  01/10/2021   Procedure: FINE NEEDLE ASPIRATION (FNA) LINEAR;  Surgeon: Irving Copas., MD;  Location: [place];  Service: Gastroenterology;;   FRACTURE SURGERY Left    metal [facility] for stabilization per mother 12/30/20   LAPAROSCOPIC PARTIAL GASTRECTOMY N/A 02/04/2021   Procedure: LAPAROSCOPIC PARTIAL GASTRECTOMY;  Surgeon: Dwan Bolt, MD;  Location: [place];  Service: General;  Laterality: N/A;  2 [place]      I have reviewed the  social history and family history with the patient and they are unchanged from previous note.  ALLERGIES:  is allergic to latex and amoxicillin.  MEDICATIONS:  Current Outpatient Medications  Medication Sig Dispense Refill   acetaminophen (TYLENOL) 500 MG tablet Take 2 tablets (1,000 mg total) by mouth every 8 (eight) hours as needed for mild pain. 30 tablet 0   Calcium Carbonate-Vitamin D 600-200 MG-UNIT TABS Take 1 tablet by mouth in the morning.     Cholecalciferol (VITAMIN D3) 50 MCG (2000 UT) TABS Take 2,000 Units by mouth in the morning.     imipramine (TOFRANIL) 50 MG tablet Take 50 mg by mouth at bedtime.     levETIRAcetam (KEPPRA) 500 MG tablet Take 250-500 mg by mouth See admin instructions. Take 0.5 tablet (250 mg) by mouth in the morning & take 1 tablet (500 mg) by mouth at night.     Multiple Vitamin (MULTIVITAMIN WITH MINERALS) TABS tablet Take 1 tablet by mouth in the morning.     omeprazole (PRILOSEC) 20 MG capsule Take 1 capsule (20 mg total) by  mouth 2 (two) times daily before a meal. (Patient taking differently: Take 20 mg by mouth in the morning and at bedtime.) 60 capsule 5   oxybutynin (DITROPAN XL) 15 MG 24 hr tablet Take 15 mg by mouth in the morning.     oxybutynin (DITROPAN) 5 MG tablet Take 5 mg by mouth at bedtime.     oxyCODONE (OXY IR/ROXICODONE) 5 MG immediate release tablet Take 1 tablet (5 mg total) by mouth every 6 (six) hours as needed for severe pain. 15 tablet 0   Probiotic Product (PROBIOTIC ADVANCED PO) Take 1 capsule by mouth in the morning.     sertraline (ZOLOFT) 100 MG tablet Take 100 mg by mouth at bedtime.     vitamin C (ASCORBIC ACID) 500 MG tablet Take 500 mg by mouth in the morning.     No current facility-administered medications for this visit.    PHYSICAL EXAMINATION: ECOG PERFORMANCE STATUS: 1 - Symptomatic but completely ambulatory  Vitals:   07/15/21 1137  BP: (!) 141/100  Pulse: (!) 108  Resp: 19  Temp: 98.2 F (36.8 C)  SpO2:  100%   Wt Readings from Last 3 Encounters:  07/15/21 138 lb (62.6 kg)  03/15/21 134 lb 12.8 oz (61.1 kg)  02/04/21 135 lb (61.2 kg)     GENERAL:alert, no distress and comfortable SKIN: skin color, texture, turgor are normal, no rashes or significant lesions EYES: normal, Conjunctiva are pink and non-injected, sclera clear  LUNGS: clear to auscultation and percussion with normal breathing effort HEART: regular rate & rhythm and no murmurs and no lower extremity edema ABDOMEN:abdomen soft, non-tender and normal bowel sounds Musculoskeletal:no cyanosis of digits and no clubbing  NEURO: alert & oriented x 3 with fluent speech, no focal motor/sensory deficits  LABORATORY DATA:  I have reviewed the data as listed CBC Latest Ref Rng & Units 07/15/2021 02/05/2021 02/04/2021  WBC 4.0 - 10.5 K/uL 5.1 11.8(H) 7.8  Hemoglobin 13.0 - 17.0 g/dL 13.8 12.4(L) 14.8  Hematocrit 39.0 - 52.0 % 40.9 37.0(L) 45.5  Platelets 150 - 400 K/uL 224 264 283     CMP Latest Ref Rng & Units 07/15/2021 02/05/2021 02/04/2021  Glucose 70 - 99 mg/dL 112(H) 108(H) 87  BUN 6 - 20 mg/dL _0 Creatinine 0.61 - 1.24 mg/dL 0.84 0.67 0.61  Sodium 135 - 145 mmol/L 142 137 138  Potassium 3.5 - 5.1 mmol/L 3.9 4.3 3.8  Chloride 98 - 111 mmol/L 102 102 100  CO2 22 - 32 mmol/L _1 Calcium 8.9 - 10.3 mg/dL 9.9 8.9 9.5  Total Protein 6.5 - 8.1 g/dL 7.8 - -  Total Bilirubin 0.3 - 1.2 mg/dL 0.4 - -  Alkaline Phos 38 - 126 U/L 104 - -  AST 15 - 41 U/L 24 - -  ALT 0 - 44 U/L 22 - -      RADIOGRAPHIC STUDIES: I have personally reviewed the radiological images as listed and agreed with the findings in the report. No results found.    Orders Placed This Encounter  Procedures   CT ABDOMEN PELVIS W CONTRAST    Standing Status:   Future    Standing Expiration Date:   07/15/2022    Order Specific Question:   If indicated for the ordered procedure, I authorize the administration of contrast media per Radiology protocol     Answer:   Yes    Order Specific Question:   Preferred imaging location?    Answer:  [facility]    Order Specific Question:   Is Oral Contrast requested for this exam?    Answer:   Yes, Per Radiology protocol    All questions were answered. The patient knows to call the clinic with any problems, questions or concerns. No barriers to learning was detected. The total time spent in the appointment was 25 minutes.     Truitt Merle, MD 07/15/2021   I, Wilburn Mylar, am acting as scribe for Truitt Merle, MD.   I have reviewed the above documentation for accuracy and completeness, and I agree with the above.

## 2021-07-16 ENCOUNTER — Telehealth: Payer: Self-pay | Admitting: Hematology

## 2021-07-16 NOTE — Telephone Encounter (Signed)
Scheduled follow-up appointments per 10/3 los. Patient's mother is aware.

## 2021-09-16 ENCOUNTER — Other Ambulatory Visit: Payer: Self-pay | Admitting: Gastroenterology

## 2021-11-08 ENCOUNTER — Other Ambulatory Visit: Payer: Self-pay | Admitting: Gastroenterology

## 2022-01-09 ENCOUNTER — Inpatient Hospital Stay: Payer: Medicare Other

## 2022-01-13 ENCOUNTER — Inpatient Hospital Stay: Payer: Medicare Other | Admitting: Hematology

## 2022-01-20 ENCOUNTER — Other Ambulatory Visit: Payer: Self-pay

## 2022-01-20 ENCOUNTER — Ambulatory Visit (HOSPITAL_COMMUNITY)
Admission: RE | Admit: 2022-01-20 | Discharge: 2022-01-20 | Disposition: A | Discharge status: Home / Self Care | Payer: Medicare Other | Source: Ambulatory Visit | Attending: Hematology | Admitting: Hematology

## 2022-01-20 ENCOUNTER — Inpatient Hospital Stay: Payer: Medicare Other | Attending: Hematology

## 2022-01-20 DIAGNOSIS — C49A2 Gastrointestinal stromal tumor of stomach: Secondary | ICD-10-CM

## 2022-01-20 DIAGNOSIS — Q059 Spina bifida, unspecified: Secondary | ICD-10-CM | POA: Insufficient documentation

## 2022-01-20 DIAGNOSIS — Z85028 Personal history of other malignant neoplasm of stomach: Secondary | ICD-10-CM | POA: Insufficient documentation

## 2022-01-20 DIAGNOSIS — K219 Gastro-esophageal reflux disease without esophagitis: Secondary | ICD-10-CM | POA: Insufficient documentation

## 2022-01-20 DIAGNOSIS — R569 Unspecified convulsions: Secondary | ICD-10-CM | POA: Insufficient documentation

## 2022-01-20 LAB — COMPREHENSIVE METABOLIC PANEL
ALT: 16 U/L (ref 0–44)
AST: 18 U/L (ref 15–41)
Albumin: 4.3 g/dL (ref 3.5–5.0)
Alkaline Phosphatase: 109 U/L (ref 38–126)
Anion gap: 7 (ref 5–15)
BUN: 12 mg/dL (ref 6–20)
CO2: 31 mmol/L (ref 22–32)
Calcium: 9.3 mg/dL (ref 8.9–10.3)
Chloride: 102 mmol/L (ref 98–111)
Creatinine, Ser: 0.86 mg/dL (ref 0.61–1.24)
GFR, Estimated: >60 mL/min (ref >60)
Glucose, Bld: 104 mg/dL — ABNORMAL HIGH (ref 70–99)
Potassium: 3.9 mmol/L (ref 3.5–5.1)
Sodium: 140 mmol/L (ref 135–145)
Total Bilirubin: 0.5 mg/dL (ref 0.3–1.2)
Total Protein: 7.5 g/dL (ref 6.5–8.1)

## 2022-01-20 LAB — CBC WITH DIFFERENTIAL/PLATELET
Abs Immature Granulocytes: 0.02 10*3/uL (ref 0.00–0.07)
Basophils Absolute: 0 10*3/uL (ref 0.0–0.1)
Basophils Relative: 1 %
Eosinophils Absolute: 0.4 10*3/uL (ref 0.0–0.5)
Eosinophils Relative: 7 %
HCT: 41.2 % (ref 39.0–52.0)
Hemoglobin: 14.4 g/dL (ref 13.0–17.0)
Immature Granulocytes: 0 %
Lymphocytes Relative: 26 %
Lymphs Abs: 1.4 10*3/uL (ref 0.7–4.0)
MCH: 30.8 pg (ref 26.0–34.0)
MCHC: 35 g/dL (ref 30.0–36.0)
MCV: 88.2 fL (ref 80.0–100.0)
Monocytes Absolute: 0.4 10*3/uL (ref 0.1–1.0)
Monocytes Relative: 8 %
Neutro Abs: 3.3 10*3/uL (ref 1.7–7.7)
Neutrophils Relative %: 58 %
Platelets: 230 10*3/uL (ref 150–400)
RBC: 4.67 MIL/uL (ref 4.22–5.81)
RDW: 12.5 % (ref 11.5–15.5)
WBC: 5.6 10*3/uL (ref 4.0–10.5)
nRBC: 0 % (ref 0.0–0.2)

## 2022-01-20 MED ORDER — IOHEXOL 300 MG/ML  SOLN
100.0000 mL | Freq: Once | INTRAMUSCULAR | Status: AC | PRN
  Administered 2022-01-20: 100 mL via INTRAVENOUS

## 2022-01-22 ENCOUNTER — Inpatient Hospital Stay (HOSPITAL_BASED_OUTPATIENT_CLINIC_OR_DEPARTMENT_OTHER): Payer: Medicare Other | Admitting: Hematology

## 2022-01-22 ENCOUNTER — Other Ambulatory Visit: Payer: Self-pay

## 2022-01-22 ENCOUNTER — Encounter: Payer: Self-pay | Admitting: Hematology

## 2022-01-22 VITALS — BP 142/97 | HR 92 | Temp 99.1°F | Resp 19 | Ht 63.0 in | Wt 139.3 lb

## 2022-01-22 DIAGNOSIS — C49A2 Gastrointestinal stromal tumor of stomach: Secondary | ICD-10-CM

## 2022-01-22 DIAGNOSIS — K219 Gastro-esophageal reflux disease without esophagitis: Secondary | ICD-10-CM | POA: Diagnosis not present

## 2022-01-22 DIAGNOSIS — Q059 Spina bifida, unspecified: Secondary | ICD-10-CM | POA: Diagnosis not present

## 2022-01-22 DIAGNOSIS — Z85028 Personal history of other malignant neoplasm of stomach: Secondary | ICD-10-CM | POA: Diagnosis present

## 2022-01-22 DIAGNOSIS — R569 Unspecified convulsions: Secondary | ICD-10-CM | POA: Diagnosis not present

## 2022-01-22 NOTE — Progress Notes (Signed)
?[place]   ?Telephone:([phone] [phone]   ?Clinic Follow up Note  ? ?Patient Care Team: ?Ludwig Clarks, FNP as PCP - General (Family Medicine) ?Jonnie Finner, RN (Inactive) as Oncology Nurse Navigator ?Truitt Merle, MD as Consulting Physician (Oncology) ?Dwan Bolt, MD as Consulting Physician (General Surgery) ?Mansouraty, Telford Nab., MD as Consulting Physician (Gastroenterology) ? ?Date of Service:  01/22/2022 ? ?CHIEF COMPLAINT: f/u of GIST ? ?CURRENT THERAPY:  ?Surveillance ? ?ASSESSMENT & PLAN:  ?Darryl Moran is a 46 y.o. male with  ? ?1. Gastrointestinal stromal Tumor of stomach, pT2N0M0 stage IA ?-during work up of his LE vascular issues, CT scan showed mass in stomach on 11/27/20  ?-His initial endoscopy was insufficient. Repeat endoscopy with Dr Rush Landmark on 01/10/21 showed an submucosal mass, biopsy showed neoplastic cells consistent with GIST.  ?-CT chest from 01/22/21 showed no metastatic disease. ?-He underwent surgical resection with Dr [name] on 02/04/21. His surgical path showed 3.5cm GIST tumor was completely removed, clear margins and mitotic rate of 6. Given size of tumor and elevated mitotic rate, he has moderate risk of metastasis at 16% in the future.  ?-His pathology also showed KIT mutation was not detected. This indicates less benefit from target therapy with Gleevec to reduce risk of future metastasis. His PDGFRa mutation was also not detected.  ?-He is under surveillance with physical exam and lab test every 3-4 months for the first 2 years, then every 6-12 months, and surveillance CT scan every 12-24 months for up to 5 years.  ?-restaging CT [facility] on 01/20/22 showed NED. Additional findings include bladder issues and a small fluid collection tethered to umbilicus which is related to his previous surgery. I reviewed the results and images with them today. He reports chronic urinary issues and is followed by a urologist. ?-he continues to do well overall.  Labs from 01/20/22 reviewed, overall WNL. Will monitor his labs for nutritional deficiencies related anemia, s/p gastric surgery.  ?-F/u in 6 months ?  ?2. 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  ?-His Spina Bifida causes vascular issues of his LE. He has low pedal pulse and circulation with no sensation below ankle. He wears feet braces. His feet intermittent turn black and purple.  ?-He will continue his medications and f/u with other physicians.  ?-On Prilosec for GERD.  ?-He has shunt in place in abdomen.  ?  ?3. Social support  ?-He lives with both his parents who are his primary care givers.  ?-He ambulates with crutches outside and uses wheelchair at home ?-He is able to take care of ADL overall independently. His parents cook for him.  ?  ?  ?PLAN:  ?-lab and f/u in 6 months ? ? ?No problem-specific Assessment & Plan notes found for this encounter. ? ? ?SUMMARY OF ONCOLOGIC HISTORY: ?Oncology History Overview Note  ?Cancer Staging ?Gastrointestinal stromal tumor (GIST) of stomach ([place]) ?Staging form: Gastrointestinal Stromal Tumor - Gastric and Omental GIST, AJCC 8th Edition ?- Clinical stage from 01/10/2021: Stage IA (cT2, cN0, cM0, Mitotic Rate: Low) - Signed by Truitt Merle, MD on 01/23/2021 ?Stage prefix: Initial diagnosis ?Histologic grade (G): Low grade ?Histologic grading system: 2 grade system ? ?  ?Gastrointestinal stromal tumor (GIST) of stomach ([place])  ?11/27/2020 Imaging  ? CT Angio 11/27/20  ?IMPRESSION: ?1. No acute arterial abnormality detected. However, evaluation below ?the knees was somewhat limited by contrast timing. ?2. Cholelithiasis without  acute inflammation. ?3. Distended urinary bladder with some mild bladder wall thickening. ?Correlate with urinalysis. Findings are suggestive of a neurogenic ?bladder. ?4. Exophytic masslike area arising from the greater curvature of the ?gastric body. This is concerning for a malignancy such  as a GIST. ?Outpatient GI follow-up is recommended. ?  ?12/04/2020 Procedure  ? Upper Endoscopy by Dr Abbey Chatters 12/04/20  ?IMPRESSION ?- Rule out malignancy, gastric tumor on the greater curvature of the stomach. Biopsied. ?- Gastritis. Biopsied. ?- Normal duodenal bulb, first portion of the duodenum and second portion of the ?duodenum. ? ?FINAL MICROSCOPIC DIAGNOSIS: 12/04/20 ?A. STOMACH, MASS, BIOPSY:  ?- Gastric oxyntic mucosa with mild chronic gastritis  ?- Negative for intestinal metaplasia, dysplasia or malignancy  ?- No submucosa is present for evaluation  ? ?B. STOMACH, ANTRUM AND BODY, BIOPSY:  ?- Gastric antral and oxyntic mucosa with mild chronic gastritis  ?- Gastric oxyntic mucosa with parietal cell hyperplasia as can be seen  ?in hypergastrinemic states such as PPI therapy.  ?- Warthin Starry stain is negative for Helicobacter pylori  ?  ?01/10/2021 Procedure  ? Upper EUS by Dr Rush Landmark 01/10/21 ?EGD Impression: ?- No gross lesions in esophagus. Z-line regular, 36 cm from the incisors. ?- Gastritis. Biopsied. ?- Rule out malignancy, gastric subepithelial lesion on the greater curvature of the stomach ?was noted. ?- No gross lesions in the duodenal bulb, in the first portion of the duodenum and in the ?second portion of the duodenum. ?EUS Impression: ?- An intramural (subepithelial) lesion was found in the greater curve of the stomach. The ?lesion appeared to originate from within the muscularis propria (Layer 4). Cytology results ?are pending. However, the endosonographic appearance is highly suspicious for a stromal ?cell (smooth muscle) neoplasm. Fine needle biopsy performed. ?- No malignant-appearing lymph nodes were visualized in the celiac region (level 20), ?perigastric region and peripancreatic region. ? ? ? ?FINAL MICROSCOPIC DIAGNOSIS: 01/10/21 ?A. STOMACH, BIOPSY:  ?- Gastric antral and oxyntic mucosa with slight chronic inflammation.  ?- Warthin-Starry negative for Helicobacter pylori.  ?- No  intestinal metaplasia, dysplasia, GIST or carcinoma.  ? ?  ?01/10/2021 Initial Biopsy  ? Initial Biopsy 01/10/21 ?FINAL MICROSCOPIC DIAGNOSIS:  ?A. STOMACH, FINE NEEDLE ASPIRATION:  ?- Neoplastic cells consistent with gastrointestinal stromal tumor  ?(GIST).  ?- See comment.  ? ?COMMENT:  ?The neoplastic cells have epithelioid morphology.  Immunohistochemistry  ?shows strong positivity with CD117 (c-kit) and CD34.  The neoplastic  ?cells are negative with cytokeratin AE1/AE3, CD56, chromogranin and  ?synaptophysin.  Immunohistochemistry with Ki-67 shows a low  ?proliferation rate.  The findings are consistent with gastrointestinal  ?stromal tumor (GIST) with epithelioid features.  ?  ?01/10/2021 Cancer Staging  ? Staging form: Gastrointestinal Stromal Tumor - Gastric and Omental GIST, AJCC 8th Edition ?- Clinical stage from 01/10/2021: Stage IA (cT2, cN0, cM0, Mitotic Rate: Low) - Signed by Truitt Merle, MD on 01/23/2021 ?Stage prefix: Initial diagnosis ?Histologic grade (G): Low grade ?Histologic grading system: 2 grade system ? ?  ?01/22/2021 Imaging  ? CT Chest  ?IMPRESSION: ?1. No pulmonary metastasis. ?2. No mediastinal nodal metastasis. ?3. Partially exophytic submucosal lesion extending from the greater ?curvature measures up to 3.5 cm. No upper abdominal adenopathy. ?  ?  ?01/23/2021 Initial Diagnosis  ? Gastrointestinal stromal tumor (GIST) of stomach ([place]) ?  ?02/04/2021 Surgery  ? LAPAROSCOPIC PARTIAL GASTRECTOMY by Dr [name] ?  ?02/04/2021 Pathology Results  ? FINAL MICROSCOPIC DIAGNOSIS:  ? ?A. STOMACH MASS, PARTIAL GASTRECTOMY:  ?-  Gastrointestinal stromal  tumor, 3.5 cm  ?-  Tumor extends to the cauterized resection margins  ?-  See oncology table and comment below  ? ?B. GASTRIC MARGIN, EXCISION:  ?-  No residual tumor identified  ? ? ? ?PDGFRa mutations no detected  ?KIT (c-KIT) Mutations:  NOT DECTECTED  ?  ?01/20/2022 Imaging  ? EXAM: ?CT ABDOMEN AND PELVIS WITH CONTRAST ? ?IMPRESSION: ?1. Postsurgical change  of gastric mass removal without evidence of local recurrence. ?2. No evidence of metastatic disease within the abdomen or pelvis. ?3. Cholelithiasis without evidence of acute inflammation. ?4. Distended ur

## 2022-05-06 IMAGING — CT CT CHEST W/ CM
2 of 4 series · 15 of 36 positions shown, 18 images · IV contrast (OMNIPAQUE)
Comparison: None.

CLINICAL DATA: Gastrointestinal stromal tumor. Just tumor of the
stomach

EXAM:
CT CHEST WITH CONTRAST
TECHNIQUE: Multidetector CT imaging of the chest was performed during
intravenous contrast administration.
CONTRAST:  75mL OMNIPAQUE IOHEXOL 300 MG/ML  SOLN

[Series 2: axial st · axial · 0.68mm/px · z∈[-324,-96]mm · 12 of 136 slices shown, 15 images]
[im 11/136  mediastinal]
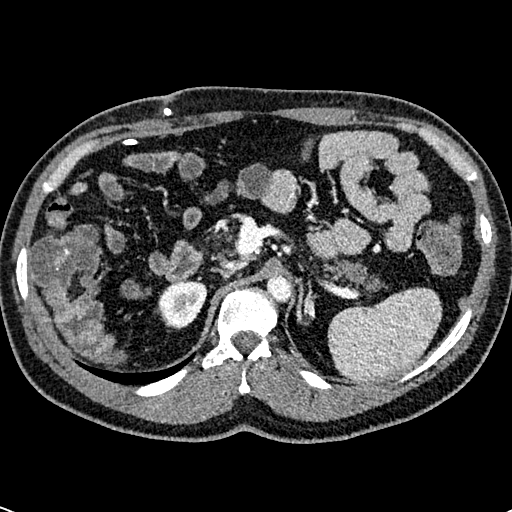
[im 11/136  lung]
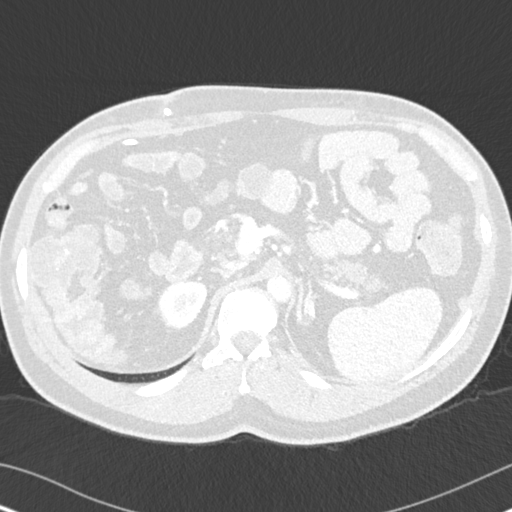
[im 21/136  lung]
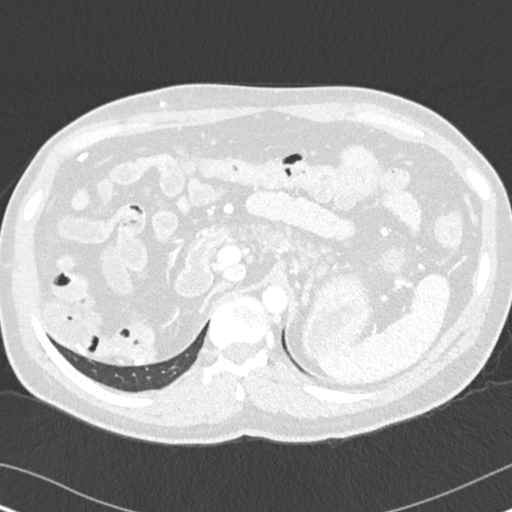
[im 32/136  lung]
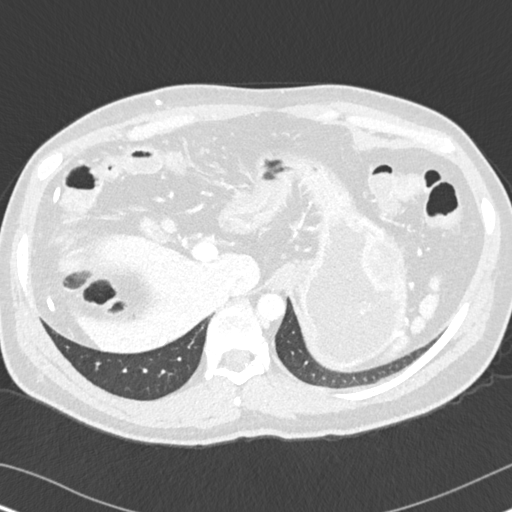
[im 42/136  lung]
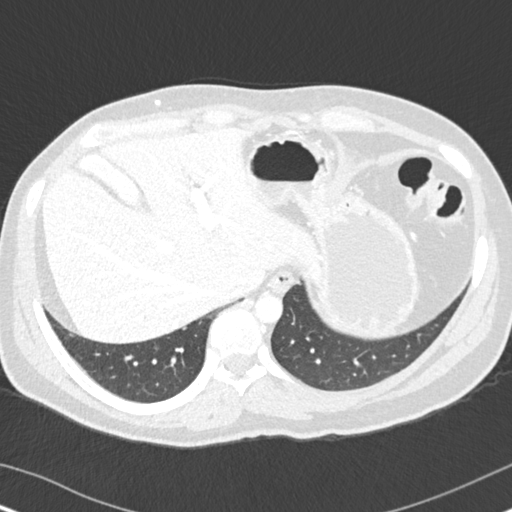
[im 52/136  mediastinal]
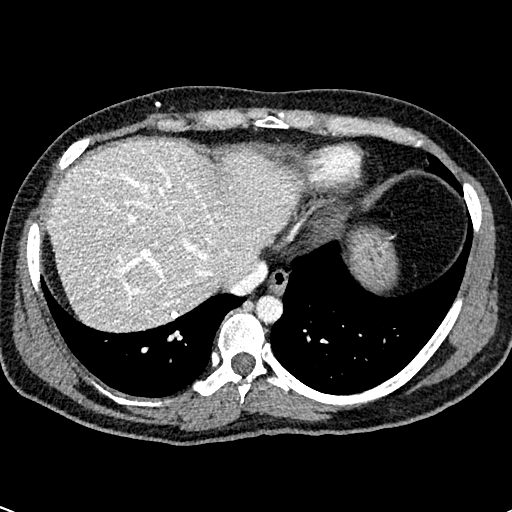
[im 52/136  lung]
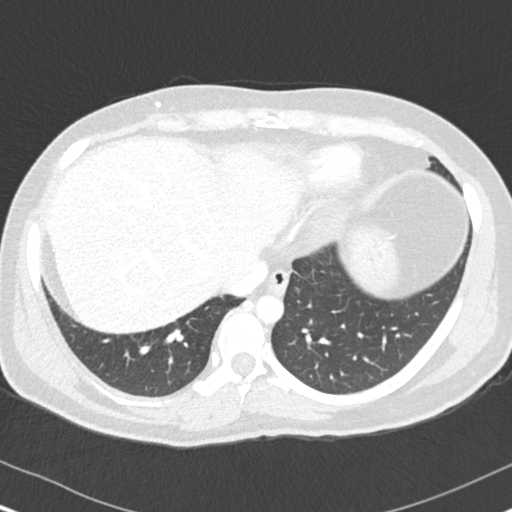
[im 63/136  lung]
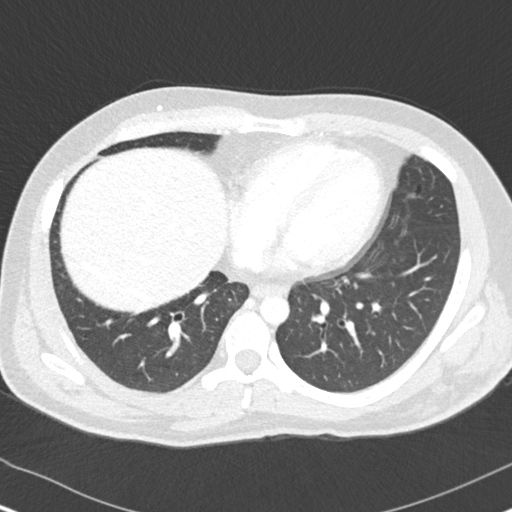
[im 73/136  lung]
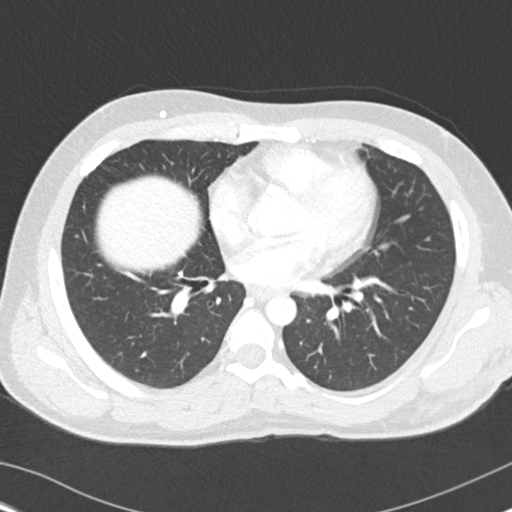
[im 84/136  lung]
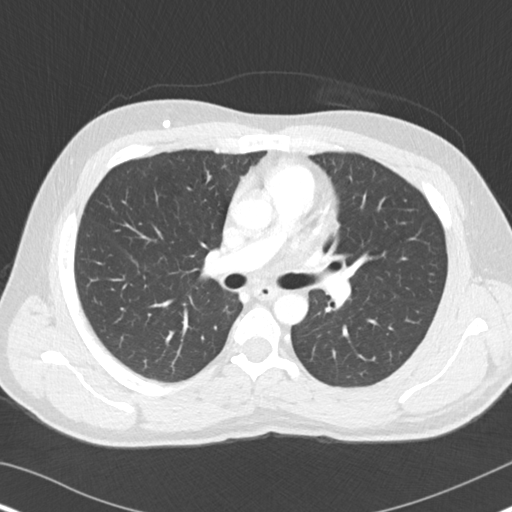
[im 94/136  mediastinal]
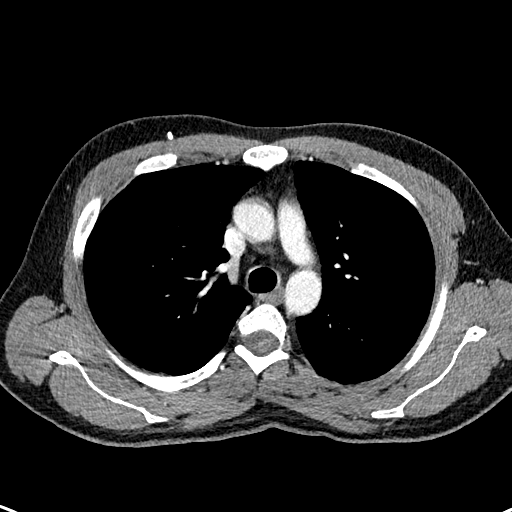
[im 94/136  lung]
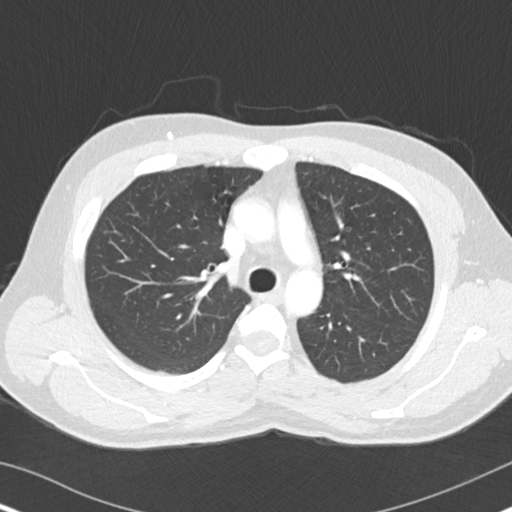
[im 104/136  lung]
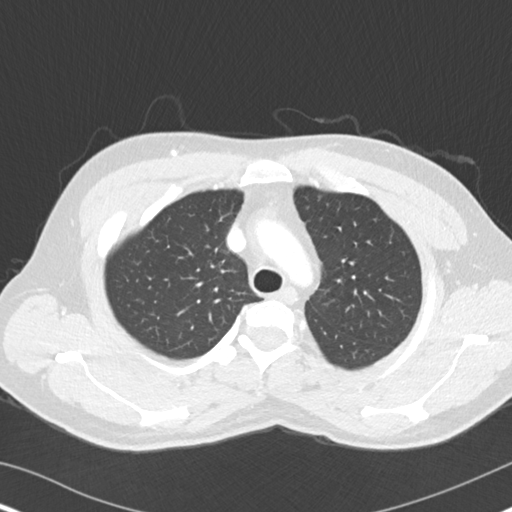
[im 115/136  lung]
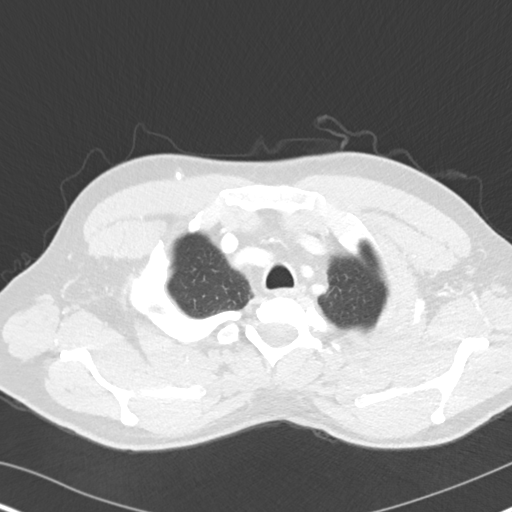
[im 125/136  lung]
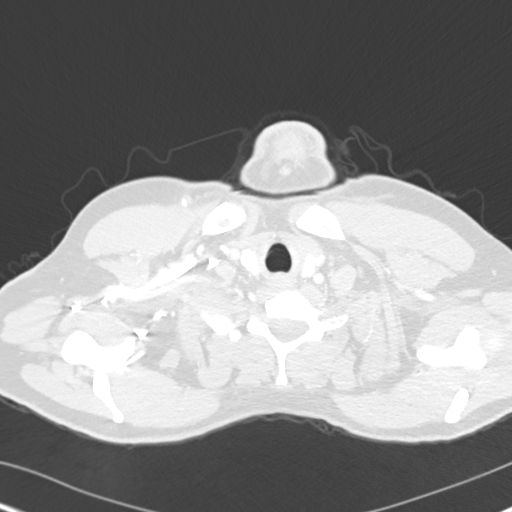

[Series 4: coronal · coronal · 0.54mm/px · 3 of 134 slices shown]
[im 27/134  lung]
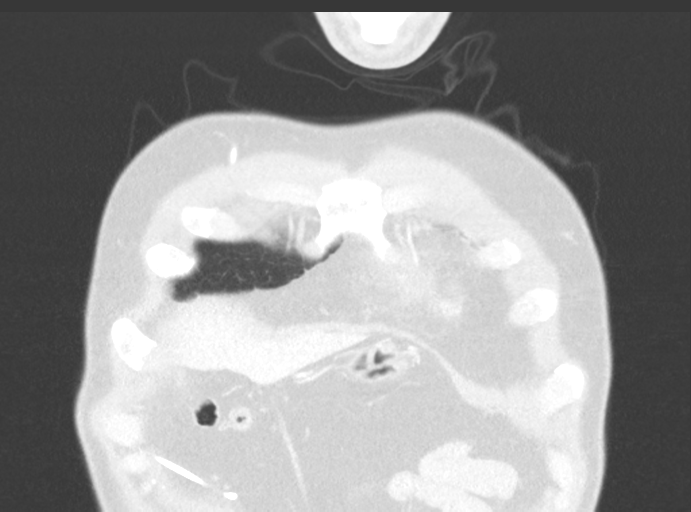
[im 54/134  lung]
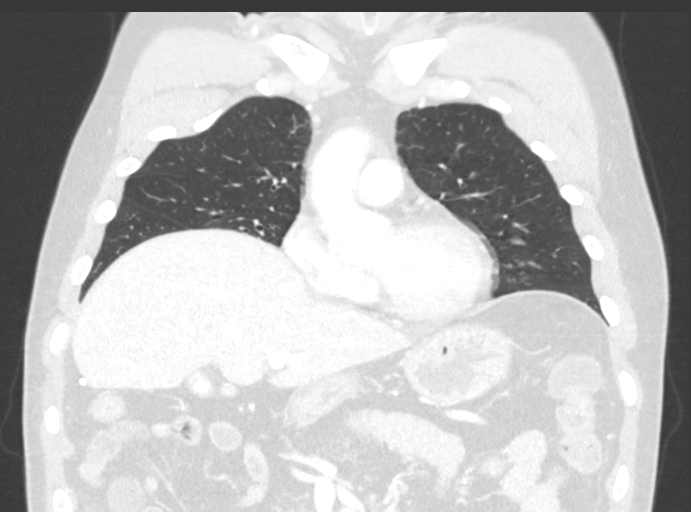
[im 80/134  lung]
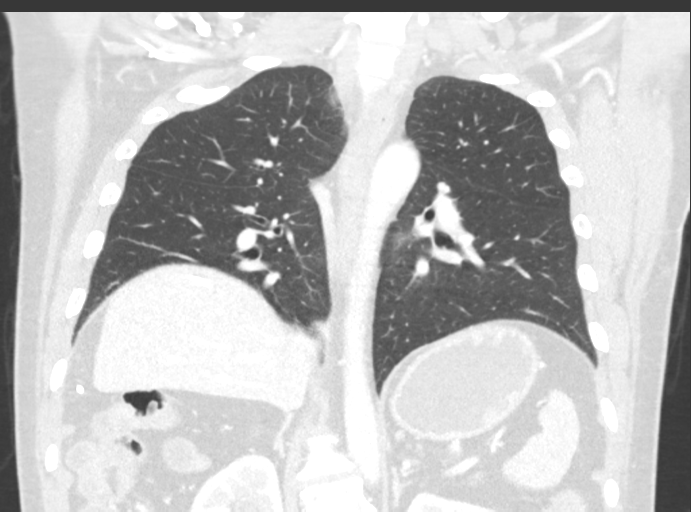

[15 of 36 positions shown; findings below may reference images not displayed]

FINDINGS: Cardiovascular: No significant vascular findings. Normal heart size.
No pericardial effusion.

Mediastinum/Nodes: No axillary or supraclavicular adenopathy. No
mediastinal or hilar adenopathy. No pericardial fluid. Esophagus
normal.

Lungs/Pleura: No suspicious pulmonary nodules.  Airways normal.

Upper Abdomen: Submucosal mass in greater curvature of the stomach
measures 3.5 by 2.7 cm image 106/2).

No gastrohepatic ligament adenopathy. Exophytic portion of the mass
measures 3.3 cm in length.

No gastrohepatic ligament adenopathy. Limited view of the liver is
unremarkable. No upper abdominal adenopathy.

Musculoskeletal: No aggressive osseous lesion.
IMPRESSION: 1. No pulmonary metastasis.
2. No mediastinal nodal metastasis.
3. Partially exophytic submucosal lesion extending from the greater
curvature measures up to 3.5 cm. No upper abdominal adenopathy.

## 2022-07-23 ENCOUNTER — Other Ambulatory Visit: Payer: Self-pay

## 2022-07-23 DIAGNOSIS — C49A2 Gastrointestinal stromal tumor of stomach: Secondary | ICD-10-CM

## 2022-07-24 ENCOUNTER — Other Ambulatory Visit: Payer: Self-pay

## 2022-07-24 ENCOUNTER — Inpatient Hospital Stay: Payer: Medicare Other | Attending: Internal Medicine

## 2022-07-24 ENCOUNTER — Inpatient Hospital Stay (HOSPITAL_BASED_OUTPATIENT_CLINIC_OR_DEPARTMENT_OTHER): Payer: Medicare Other | Admitting: Hematology

## 2022-07-24 ENCOUNTER — Encounter: Payer: Self-pay | Admitting: Hematology

## 2022-07-24 VITALS — BP 124/97 | HR 110 | Temp 98.4°F | Resp 21 | Ht 63.0 in | Wt 137.6 lb

## 2022-07-24 DIAGNOSIS — Q059 Spina bifida, unspecified: Secondary | ICD-10-CM | POA: Diagnosis not present

## 2022-07-24 DIAGNOSIS — R569 Unspecified convulsions: Secondary | ICD-10-CM | POA: Diagnosis not present

## 2022-07-24 DIAGNOSIS — Z08 Encounter for follow-up examination after completed treatment for malignant neoplasm: Secondary | ICD-10-CM | POA: Diagnosis present

## 2022-07-24 DIAGNOSIS — Z85028 Personal history of other malignant neoplasm of stomach: Secondary | ICD-10-CM | POA: Diagnosis present

## 2022-07-24 DIAGNOSIS — C49A2 Gastrointestinal stromal tumor of stomach: Secondary | ICD-10-CM

## 2022-07-24 DIAGNOSIS — K219 Gastro-esophageal reflux disease without esophagitis: Secondary | ICD-10-CM | POA: Diagnosis not present

## 2022-07-24 LAB — CBC WITH DIFFERENTIAL (CANCER CENTER ONLY)
Abs Immature Granulocytes: 0.02 10*3/uL (ref 0.00–0.07)
Basophils Absolute: 0 10*3/uL (ref 0.0–0.1)
Basophils Relative: 0 %
Eosinophils Absolute: 0.3 10*3/uL (ref 0.0–0.5)
Eosinophils Relative: 6 %
HCT: 42.8 % (ref 39.0–52.0)
Hemoglobin: 14.8 g/dL (ref 13.0–17.0)
Immature Granulocytes: 0 %
Lymphocytes Relative: 27 %
Lymphs Abs: 1.4 10*3/uL (ref 0.7–4.0)
MCH: 30.7 pg (ref 26.0–34.0)
MCHC: 34.6 g/dL (ref 30.0–36.0)
MCV: 88.8 fL (ref 80.0–100.0)
Monocytes Absolute: 0.4 10*3/uL (ref 0.1–1.0)
Monocytes Relative: 8 %
Neutro Abs: 3 10*3/uL (ref 1.7–7.7)
Neutrophils Relative %: 59 %
Platelet Count: 219 10*3/uL (ref 150–400)
RBC: 4.82 MIL/uL (ref 4.22–5.81)
RDW: 12.5 % (ref 11.5–15.5)
WBC Count: 5.1 10*3/uL (ref 4.0–10.5)
nRBC: 0 % (ref 0.0–0.2)

## 2022-07-24 LAB — CMP (CANCER CENTER ONLY)
ALT: 21 U/L (ref 0–44)
AST: 19 U/L (ref 15–41)
Albumin: 4.5 g/dL (ref 3.5–5.0)
Alkaline Phosphatase: 99 U/L (ref 38–126)
Anion gap: 5 (ref 5–15)
BUN: 13 mg/dL (ref 6–20)
CO2: 33 mmol/L — ABNORMAL HIGH (ref 22–32)
Calcium: 9.1 mg/dL (ref 8.9–10.3)
Chloride: 102 mmol/L (ref 98–111)
Creatinine: 0.77 mg/dL (ref 0.61–1.24)
GFR, Estimated: >60 mL/min (ref >60)
Glucose, Bld: 115 mg/dL — ABNORMAL HIGH (ref 70–99)
Potassium: 3.7 mmol/L (ref 3.5–5.1)
Sodium: 140 mmol/L (ref 135–145)
Total Bilirubin: 0.4 mg/dL (ref 0.3–1.2)
Total Protein: 7.8 g/dL (ref 6.5–8.1)

## 2022-07-24 LAB — FERRITIN: Ferritin: 26 ng/mL (ref 24–336)

## 2022-07-24 LAB — VITAMIN B12: Vitamin B-12: 728 pg/mL (ref 180–914)

## 2022-07-24 NOTE — Progress Notes (Signed)
[place]   Telephone:(336) [phone] Fax:([phone]) [phone]   Clinic Follow up Note   Patient Care Team: Ludwig Clarks, FNP as PCP - General (Family Medicine) Jonnie Finner, RN (Inactive) as Oncology Nurse Navigator Truitt Merle, MD as Consulting Physician (Oncology) Dwan Bolt, MD as Consulting Physician (General Surgery) Mansouraty, Telford Nab., MD as Consulting Physician (Gastroenterology)  Date of Service:  07/24/2022  CHIEF COMPLAINT: f/u of GIST  CURRENT THERAPY:  Surveillance  ASSESSMENT & PLAN:  Darryl Moran is a 46 y.o. male with   1. Gastrointestinal stromal Tumor of stomach, pT2N0M0 stage IA -incidental findings during work up his LE vascular issues on CT 11/27/20  -endoscopy with Dr Rush Landmark on 01/10/21, biopsy showed neoplastic cells consistent with GIST.  -CT chest 01/22/21 was negative. -s/p surgical resection with Dr [name] on 02/04/21, path showed 3.5cm GIST tumor, clear margins and mitotic rate of 6. KIT and PDGFRa mutations not detected.  -restaging CT [facility] on 01/20/22 showed NED.  -he continues to do well overall. Labs reviewed, overall WNL. Physical exam was unremarkable. There is no clinical concern for recurrence. -F/u in 6 months with repeat CT   2. 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  -His Spina Bifida causes vascular issues of his LE. He has low pedal pulse and circulation with no sensation below ankle. He wears feet braces. -continue medications and f/u with other physicians.  -On Prilosec for GERD.  -He has shunt in place in abdomen.    3. Social support  -He lives with both his parents who are his primary care givers.  -He ambulates with crutches outside and uses wheelchair at home -He is able to take care of ADL overall independently, though his parents cook for him.      PLAN:  -f/u in 6 months with lab and CT several days before   No problem-specific  Assessment & Plan notes found for this encounter.   SUMMARY OF ONCOLOGIC HISTORY: Oncology History Overview Note  Cancer Staging Gastrointestinal stromal tumor (GIST) of stomach (HCC) Staging form: Gastrointestinal Stromal Tumor - Gastric and Omental GIST, AJCC 8th Edition - Clinical stage from 01/10/2021: Stage IA (cT2, cN0, cM0, Mitotic Rate: Low) - Signed by Truitt Merle, MD on 01/23/2021 Stage prefix: Initial diagnosis Histologic grade (G): Low grade Histologic grading system: 2 grade system    Gastrointestinal stromal tumor (GIST) of stomach ([name])  11/27/2020 Imaging   CT Angio 11/27/20  IMPRESSION: 1. No acute arterial abnormality detected. However, evaluation below the knees was somewhat limited by contrast timing. 2. Cholelithiasis without acute inflammation. 3. Distended urinary bladder with some mild bladder wall thickening. Correlate with urinalysis. Findings are suggestive of a neurogenic bladder. 4. Exophytic masslike area arising from the greater curvature of the gastric body. This is concerning for a malignancy such as a GIST. Outpatient GI follow-up is recommended.   12/04/2020 Procedure   Upper Endoscopy by Dr Abbey Chatters 12/04/20  IMPRESSION - Rule out malignancy, gastric tumor on the greater curvature of the stomach. Biopsied. - Gastritis. Biopsied. - Normal duodenal bulb, first portion of the duodenum and second portion of the duodenum.  FINAL MICROSCOPIC DIAGNOSIS: 12/04/20 A. STOMACH, MASS, BIOPSY:  - Gastric oxyntic mucosa with mild chronic gastritis  - Negative for intestinal metaplasia, dysplasia or malignancy  - No submucosa is present for evaluation   B. STOMACH, ANTRUM AND BODY, BIOPSY:  - Gastric antral and oxyntic mucosa with mild  chronic gastritis  - Gastric oxyntic mucosa with parietal cell hyperplasia as can be seen  in hypergastrinemic states such as PPI therapy.  - Warthin Starry stain is negative for Helicobacter pylori    01/10/2021 Procedure    Upper EUS by Dr Rush Landmark 01/10/21 EGD Impression: - No gross lesions in esophagus. Z-line regular, 36 cm from the incisors. - Gastritis. Biopsied. - Rule out malignancy, gastric subepithelial lesion on the greater curvature of the stomach was noted. - No gross lesions in the duodenal bulb, in the first portion of the duodenum and in the second portion of the duodenum. EUS Impression: - An intramural (subepithelial) lesion was found in the greater curve of the stomach. The lesion appeared to originate from within the muscularis propria (Layer 4). Cytology results are pending. However, the endosonographic appearance is highly suspicious for a stromal cell (smooth muscle) neoplasm. Fine needle biopsy performed. - No malignant-appearing lymph nodes were visualized in the celiac region (level 20), perigastric region and peripancreatic region.    FINAL MICROSCOPIC DIAGNOSIS: 01/10/21 A. STOMACH, BIOPSY:  - Gastric antral and oxyntic mucosa with slight chronic inflammation.  - Warthin-Starry negative for Helicobacter pylori.  - No intestinal metaplasia, dysplasia, GIST or carcinoma.     01/10/2021 Initial Biopsy   Initial Biopsy 01/10/21 FINAL MICROSCOPIC DIAGNOSIS:  A. STOMACH, FINE NEEDLE ASPIRATION:  - Neoplastic cells consistent with gastrointestinal stromal tumor  (GIST).  - See comment.   COMMENT:  The neoplastic cells have epithelioid morphology.  Immunohistochemistry  shows strong positivity with CD117 (c-kit) and CD34.  The neoplastic  cells are negative with cytokeratin AE1/AE3, CD56, chromogranin and  synaptophysin.  Immunohistochemistry with Ki-67 shows a low  proliferation rate.  The findings are consistent with gastrointestinal  stromal tumor (GIST) with epithelioid features.    01/10/2021 Cancer Staging   Staging form: Gastrointestinal Stromal Tumor - Gastric and Omental GIST, AJCC 8th Edition - Clinical stage from 01/10/2021: Stage IA (cT2, cN0, cM0, Mitotic Rate: Low)  - Signed by Truitt Merle, MD on 01/23/2021 Stage prefix: Initial diagnosis Histologic grade (G): Low grade Histologic grading system: 2 grade system   01/22/2021 Imaging   CT Chest  IMPRESSION: 1. No pulmonary metastasis. 2. No mediastinal nodal metastasis. 3. Partially exophytic submucosal lesion extending from the greater curvature measures up to 3.5 cm. No upper abdominal adenopathy.     01/23/2021 Initial Diagnosis   Gastrointestinal stromal tumor (GIST) of stomach ([place])   02/04/2021 Surgery   LAPAROSCOPIC PARTIAL GASTRECTOMY by Dr [name]   02/04/2021 Pathology Results   FINAL MICROSCOPIC DIAGNOSIS:   A. STOMACH MASS, PARTIAL GASTRECTOMY:  -  Gastrointestinal stromal tumor, 3.5 cm  -  Tumor extends to the cauterized resection margins  -  See oncology table and comment below   B. GASTRIC MARGIN, EXCISION:  -  No residual tumor identified     PDGFRa mutations no detected  KIT (c-KIT) Mutations:  NOT DECTECTED    01/20/2022 Imaging   EXAM: CT ABDOMEN AND PELVIS WITH CONTRAST  IMPRESSION: 1. Postsurgical change of gastric mass removal without evidence of local recurrence. 2. No evidence of metastatic disease within the abdomen or pelvis. 3. Cholelithiasis without evidence of acute inflammation. 4. Distended urinary bladder with some irregular urinary bladder wall thickening, similar to prior possibly reflecting sequela of neurogenic bladder. Consider further evaluation with cystoscopy if clinically indicated. 5. 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 6. Tubular oval fluid density focus  which appears tethered to the umbilicus with a linear band extending to the urinary bladder dome measuring approximally 2.3 x 1.3 x 1.2 cm most consistent with a urachal cyst. 7. Focal subcutaneous stranding with soft tissue thickening posterior to the left greater than right femoral  trochanters may reflect developing pressure wounds.      INTERVAL HISTORY:  OSINACHI NAVARRETTE is here for a follow up of GIST. He was last seen by me on 01/22/22. He presents to the clinic accompanied by both parents. He reports he is doing well overall. His father notes they both deal with GERD.   All other systems were reviewed with the patient and are negative.  MEDICAL HISTORY:  Past Medical History:  Diagnosis Date   GERD (gastroesophageal reflux disease)    GERD (gastroesophageal reflux disease)    Seizure ([place])    unknown etiology and on keppra now. last seizure was 5 years ago as of 11/30/2020.   Spina bifida [facility])     SURGICAL HISTORY: Past Surgical History:  Procedure Laterality Date   BIOPSY  12/04/2020   Procedure: BIOPSY;  Surgeon: [name], DO;  [facility];  Service: Endoscopy;;   BIOPSY  01/10/2021   Procedure: BIOPSY;  Surgeon: Irving Copas., MD;  Location: [place];  Service: Gastroenterology;;   ESOPHAGOGASTRODUODENOSCOPY (EGD) WITH PROPOFOL N/A 12/04/2020   Procedure: ESOPHAGOGASTRODUODENOSCOPY (EGD) WITH PROPOFOL;  Surgeon: [name], DO;  [facility];  Service: Endoscopy;  Laterality: N/A;  9:00am   ESOPHAGOGASTRODUODENOSCOPY (EGD) WITH PROPOFOL N/A 01/10/2021   Procedure: ESOPHAGOGASTRODUODENOSCOPY (EGD) WITH PROPOFOL;  Surgeon: Rush Landmark Telford Nab., MD;  Location: [place];  Service: Gastroenterology;  Laterality: N/A;   EUS N/A 01/10/2021   Procedure: UPPER ENDOSCOPIC ULTRASOUND (EUS) RADIAL;  Surgeon: Irving Copas., MD;  Location: [place];  Service: Gastroenterology;  Laterality: N/A;   FINE NEEDLE ASPIRATION  01/10/2021   Procedure: FINE NEEDLE ASPIRATION (FNA) LINEAR;  Surgeon: Irving Copas., MD;  Location: [place];  Service: Gastroenterology;;   FRACTURE SURGERY Left    metal plate placed in leg at [facility] for stabilization per mother 12/30/20   LAPAROSCOPIC PARTIAL  GASTRECTOMY N/A 02/04/2021   Procedure: LAPAROSCOPIC PARTIAL GASTRECTOMY;  Surgeon: Dwan Bolt, MD;  Location: [place];  Service: General;  Laterality: N/A;  2 [place]      I have reviewed the social history and family history with the patient and they are unchanged from previous note.  ALLERGIES:  is allergic to latex and amoxicillin.  MEDICATIONS:  Current Outpatient Medications  Medication Sig Dispense Refill   acetaminophen (TYLENOL) 500 MG tablet Take 2 tablets (1,000 mg total) by mouth every 8 (eight) hours as needed for mild pain. 30 tablet 0   Calcium Carbonate-Vitamin D 600-200 MG-UNIT TABS Take 1 tablet by mouth in the morning.     Cholecalciferol (VITAMIN D3) 50 MCG (2000 UT) TABS Take 2,000 Units by mouth in the morning.     imipramine (TOFRANIL) 50 MG tablet Take 50 mg by mouth at bedtime.     levETIRAcetam (KEPPRA) 500 MG tablet Take 250-500 mg by mouth See admin instructions. Take 0.5 tablet (250 mg) by mouth in the morning & take 1 tablet (500 mg) by mouth at night.     Multiple Vitamin (MULTIVITAMIN WITH MINERALS) TABS tablet Take 1 tablet by mouth in the morning.  omeprazole (PRILOSEC) 20 MG capsule TAKE 1 CAPSULE BY MOUTH TWICE DAILY BEFORE A MEALS. 60 capsule 11   oxybutynin (DITROPAN XL) 15 MG 24 hr tablet Take 15 mg by mouth in the morning.     oxybutynin (DITROPAN) 5 MG tablet Take 5 mg by mouth at bedtime.     oxyCODONE (OXY IR/ROXICODONE) 5 MG immediate release tablet Take 1 tablet (5 mg total) by mouth every 6 (six) hours as needed for severe pain. 15 tablet 0   Probiotic Product (PROBIOTIC ADVANCED PO) Take 1 capsule by mouth in the morning.     sertraline (ZOLOFT) 100 MG tablet Take 100 mg by mouth at bedtime.     vitamin C (ASCORBIC ACID) 500 MG tablet Take 500 mg by mouth in the morning.     No current facility-administered medications for this visit.    PHYSICAL EXAMINATION: ECOG PERFORMANCE  STATUS: 0 - Asymptomatic  Vitals:   07/24/22 1254  BP: (!) 124/97  Pulse: (!) 110  Resp: (!) 21  Temp: 98.4 F (36.9 C)  SpO2: 100%   Wt Readings from Last 3 Encounters:  07/24/22 137 lb 9.6 oz (62.4 kg)  01/22/22 139 lb 4.8 oz (63.2 kg)  07/15/21 138 lb (62.6 kg)     GENERAL:alert, no distress and comfortable SKIN: skin color, texture, turgor are normal, no rashes or significant lesions EYES: normal, Conjunctiva are pink and non-injected, sclera clear  NECK: supple, thyroid normal size, non-tender, without nodularity LYMPH:  no palpable lymphadenopathy in the cervical, axillary  LUNGS: clear to auscultation and percussion with normal breathing effort HEART: regular rate & rhythm and no murmurs and no lower extremity edema ABDOMEN:abdomen soft, non-tender and normal bowel sounds Musculoskeletal:no cyanosis of digits and no clubbing  NEURO: alert & oriented x 3 with fluent speech, no focal motor/sensory deficits  LABORATORY DATA:  I have reviewed the data as listed    Latest Ref Rng & Units 07/24/2022   12:27 PM 01/20/2022   12:11 PM 07/15/2021   11:08 AM  CBC  WBC 4.0 - 10.5 K/uL 5.1  5.6  5.1   Hemoglobin 13.0 - 17.0 g/dL 14.8  14.4  13.8   Hematocrit 39.0 - 52.0 % 42.8  41.2  40.9   Platelets 150 - 400 K/uL 219  230  224         Latest Ref Rng & Units 07/24/2022   12:27 PM 01/20/2022   12:11 PM 07/15/2021   11:08 AM  CMP  Glucose 70 - 99 mg/dL 115  104  112   BUN 6 - 20 mg/dL _0 Creatinine 0.61 - 1.24 mg/dL 0.77  0.86  0.84   Sodium 135 - 145 mmol/L 140  140  142   Potassium 3.5 - 5.1 mmol/L 3.7  3.9  3.9   Chloride 98 - 111 mmol/L 102  102  102   CO2 22 - 32 mmol/L 33  31  30   Calcium 8.9 - 10.3 mg/dL 9.1  9.3  9.9   Total Protein 6.5 - 8.1 g/dL 7.8  7.5  7.8   Total Bilirubin 0.3 - 1.2 mg/dL 0.4  0.5  0.4   Alkaline Phos 38 - 126 U/L 99  109  104   AST 15 - 41 U/L _1 ALT 0 - 44 U/L _2 RADIOGRAPHIC STUDIES: I have  personally reviewed the radiological  images as listed and agreed with the findings in the report. No results found.    Orders Placed This Encounter  Procedures   CT ABDOMEN PELVIS W CONTRAST    Standing Status:   Future    Standing Expiration Date:   07/25/2023    Order Specific Question:   If indicated for the ordered procedure, I authorize the administration of contrast media per Radiology protocol    Answer:   Yes    Order Specific Question:   Preferred imaging location?    Answer:   [facility]    Order Specific Question:   Is Oral Contrast requested for this exam?    Answer:   Yes, Per Radiology protocol   All questions were answered. The patient knows to call the clinic with any problems, questions or concerns. No barriers to learning was detected. The total time spent in the appointment was 25 minutes.     Truitt Merle, MD 07/24/2022   I, Wilburn Mylar, am acting as scribe for Truitt Merle, MD.   I have reviewed the above documentation for accuracy and completeness, and I agree with the above.

## 2023-01-26 ENCOUNTER — Inpatient Hospital Stay: Payer: Medicare Other | Attending: Nurse Practitioner

## 2023-01-26 ENCOUNTER — Other Ambulatory Visit: Payer: Self-pay

## 2023-01-26 ENCOUNTER — Ambulatory Visit (HOSPITAL_COMMUNITY)
Admission: RE | Admit: 2023-01-26 | Discharge: 2023-01-26 | Disposition: A | Discharge status: Home / Self Care | Payer: Medicare Other | Source: Ambulatory Visit | Attending: Hematology | Admitting: Hematology

## 2023-01-26 DIAGNOSIS — C49A2 Gastrointestinal stromal tumor of stomach: Secondary | ICD-10-CM | POA: Diagnosis present

## 2023-01-26 DIAGNOSIS — Z08 Encounter for follow-up examination after completed treatment for malignant neoplasm: Secondary | ICD-10-CM | POA: Insufficient documentation

## 2023-01-26 DIAGNOSIS — Z85028 Personal history of other malignant neoplasm of stomach: Secondary | ICD-10-CM | POA: Insufficient documentation

## 2023-01-26 LAB — CBC WITH DIFFERENTIAL (CANCER CENTER ONLY)
Abs Immature Granulocytes: 0.02 10*3/uL (ref 0.00–0.07)
Basophils Absolute: 0 10*3/uL (ref 0.0–0.1)
Basophils Relative: 1 %
Eosinophils Absolute: 0.3 10*3/uL (ref 0.0–0.5)
Eosinophils Relative: 6 %
HCT: 42.8 % (ref 39.0–52.0)
Hemoglobin: 14.5 g/dL (ref 13.0–17.0)
Immature Granulocytes: 0 %
Lymphocytes Relative: 25 %
Lymphs Abs: 1.3 10*3/uL (ref 0.7–4.0)
MCH: 30.5 pg (ref 26.0–34.0)
MCHC: 33.9 g/dL (ref 30.0–36.0)
MCV: 89.9 fL (ref 80.0–100.0)
Monocytes Absolute: 0.4 10*3/uL (ref 0.1–1.0)
Monocytes Relative: 8 %
Neutro Abs: 3.1 10*3/uL (ref 1.7–7.7)
Neutrophils Relative %: 60 %
Platelet Count: 234 10*3/uL (ref 150–400)
RBC: 4.76 MIL/uL (ref 4.22–5.81)
RDW: 12.5 % (ref 11.5–15.5)
WBC Count: 5.2 10*3/uL (ref 4.0–10.5)
nRBC: 0 % (ref 0.0–0.2)

## 2023-01-26 LAB — CMP (CANCER CENTER ONLY)
ALT: 21 U/L (ref 0–44)
AST: 23 U/L (ref 15–41)
Albumin: 4.6 g/dL (ref 3.5–5.0)
Alkaline Phosphatase: 101 U/L (ref 38–126)
Anion gap: 7 (ref 5–15)
BUN: 13 mg/dL (ref 6–20)
CO2: 32 mmol/L (ref 22–32)
Calcium: 9.6 mg/dL (ref 8.9–10.3)
Chloride: 102 mmol/L (ref 98–111)
Creatinine: 0.78 mg/dL (ref 0.61–1.24)
GFR, Estimated: >60 mL/min (ref >60)
Glucose, Bld: 89 mg/dL (ref 70–99)
Potassium: 3.9 mmol/L (ref 3.5–5.1)
Sodium: 141 mmol/L (ref 135–145)
Total Bilirubin: 0.4 mg/dL (ref 0.3–1.2)
Total Protein: 7.9 g/dL (ref 6.5–8.1)

## 2023-01-26 MED ORDER — IOHEXOL 300 MG/ML  SOLN
100.0000 mL | Freq: Once | INTRAMUSCULAR | Status: AC | PRN
  Administered 2023-01-26: 80 mL via INTRAVENOUS

## 2023-01-26 MED ORDER — SODIUM CHLORIDE (PF) 0.9 % IJ SOLN
INTRAMUSCULAR | Status: AC
  Filled 2023-01-26: qty 50

## 2023-01-27 NOTE — Assessment & Plan Note (Signed)
pT2N0M0 stage IA -incidental findings during work up his LE vascular issues on CT 11/27/20  -endoscopy with Dr Meridee Score on 01/10/21, biopsy showed neoplastic cells consistent with GIST.  -CT chest 01/22/21 was negative. -s/p surgical resection with Dr Freida Busman on 02/04/21, path showed 3.5cm GIST tumor, clear margins and mitotic rate of 6. KIT and PDGFRa mutations not detected.  -restaging CT [facility] on 01/26/2023 showed NED.

## 2023-01-27 NOTE — Progress Notes (Unsigned)
[facility]   Telephone:(336) [phone] Fax:(336) [phone]   Clinic Follow up Note   Patient Care Team: Oneal Grout, FNP as PCP - General (Family Medicine) Radonna Ricker, RN (Inactive) as Oncology Nurse Navigator Malachy Mood, MD as Consulting Physician (Oncology) Fritzi Mandes, MD as Consulting Physician (General Surgery) Mansouraty, Netty Starring., MD as Consulting Physician (Gastroenterology)  Date of Service:  01/28/2023  CHIEF COMPLAINT: f/u of  GIST   CURRENT THERAPY:  Surveillance  ASSESSMENT:  Darryl Moran is a 47 y.o. 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  -CT chest 01/22/21 was negative. -s/p surgical resection with Dr Freida Busman on 02/04/21, path showed 3.5cm GIST tumor, clear margins and mitotic rate of 6. KIT and PDGFRa mutations not detected.  -restaging CT AP on 01/26/2023 showed NED.  I personally reviewed his CT scan images and discussed the findings with patient and his parents.  He is clinically doing well, no concern for recurrence. -Will continue surveillance, plan to repeat CT scan in 6 months, then every other year.                   PLAN: -lab reviewed - Discuss CT scan -negative -Continue Monitoring -lab and f/u in 6 months -CT scan in 1 year, order next visit.   SUMMARY OF ONCOLOGIC HISTORY: Oncology History Overview Note  Cancer Staging Gastrointestinal stromal tumor (GIST) of stomach (HCC) Staging form: Gastrointestinal Stromal Tumor - Gastric and Omental GIST, AJCC 8th Edition - Clinical stage from 01/10/2021: Stage IA (cT2, cN0, cM0, Mitotic Rate: Low) - Signed by Malachy Mood, MD on 01/23/2021 Stage prefix: Initial diagnosis Histologic grade (G): Low grade Histologic grading system: 2 grade system    Gastrointestinal  stromal tumor (GIST) of stomach  11/27/2020 Imaging   CT Angio 11/27/20  IMPRESSION: 1. No acute arterial abnormality detected. However, evaluation below the knees was somewhat limited by contrast timing. 2. Cholelithiasis without acute inflammation. 3. Distended urinary bladder with some mild bladder wall thickening. Correlate with urinalysis. Findings are suggestive of a neurogenic bladder. 4. Exophytic masslike area arising from the greater curvature of the gastric body. This is concerning for a malignancy such as a GIST. Outpatient GI follow-up is recommended.   12/04/2020 Procedure   Upper Endoscopy by Dr Marletta Lor 12/04/20  IMPRESSION - Rule out malignancy, gastric tumor on the greater curvature of the stomach. Biopsied. - Gastritis. Biopsied. - Normal duodenal bulb, first portion of the duodenum and second portion of the duodenum.  FINAL MICROSCOPIC DIAGNOSIS: 12/04/20 A. STOMACH, MASS, BIOPSY:  - Gastric oxyntic mucosa with mild chronic gastritis  - Negative for intestinal metaplasia, dysplasia or malignancy  - No submucosa is present for evaluation   B. STOMACH, ANTRUM AND BODY, BIOPSY:  - Gastric antral and oxyntic mucosa with mild chronic gastritis  - Gastric oxyntic mucosa with parietal cell hyperplasia as can be seen  in hypergastrinemic states such as PPI therapy.  - Warthin Starry stain is negative for Helicobacter pylori    01/10/2021 Procedure   Upper EUS by Dr Meridee Score 01/10/21 EGD Impression: - No gross lesions in esophagus. Z-line regular, 36 cm from the incisors. - Gastritis. Biopsied. - Rule out malignancy, gastric subepithelial lesion on the greater curvature of the stomach was noted. - No gross lesions in the  duodenal bulb, in the first portion of the duodenum and in the second portion of the duodenum. EUS Impression: - An intramural (subepithelial) lesion was found in the greater curve of the stomach. The lesion appeared to originate from within the  muscularis propria (Layer 4). Cytology results are pending. However, the endosonographic appearance is highly suspicious for a stromal cell (smooth muscle) neoplasm. Fine needle biopsy performed. - No malignant-appearing lymph nodes were visualized in the celiac region (level 20), perigastric region and peripancreatic region.    FINAL MICROSCOPIC DIAGNOSIS: 01/10/21 A. STOMACH, BIOPSY:  - Gastric antral and oxyntic mucosa with slight chronic inflammation.  - Warthin-Starry negative for Helicobacter pylori.  - No intestinal metaplasia, dysplasia, GIST or carcinoma.     01/10/2021 Initial Biopsy   Initial Biopsy 01/10/21 FINAL MICROSCOPIC DIAGNOSIS:  A. STOMACH, FINE NEEDLE ASPIRATION:  - Neoplastic cells consistent with gastrointestinal stromal tumor  (GIST).  - See comment.   COMMENT:  The neoplastic cells have epithelioid morphology.  Immunohistochemistry  shows strong positivity with CD117 (c-kit) and CD34.  The neoplastic  cells are negative with cytokeratin AE1/AE3, CD56, chromogranin and  synaptophysin.  Immunohistochemistry with Ki-67 shows a low  proliferation rate.  The findings are consistent with gastrointestinal  stromal tumor (GIST) with epithelioid features.    01/10/2021 Cancer Staging   Staging form: Gastrointestinal Stromal Tumor - Gastric and Omental GIST, AJCC 8th Edition - Clinical stage from 01/10/2021: Stage IA (cT2, cN0, cM0, Mitotic Rate: Low) - Signed by Malachy Mood, MD on 01/23/2021 Stage prefix: Initial diagnosis Histologic grade (G): Low grade Histologic grading system: 2 grade system   01/22/2021 Imaging   CT Chest  IMPRESSION: 1. No pulmonary metastasis. 2. No mediastinal nodal metastasis. 3. Partially exophytic submucosal lesion extending from the greater curvature measures up to 3.5 cm. No upper abdominal adenopathy.     01/23/2021 Initial Diagnosis   Gastrointestinal stromal tumor (GIST) of stomach (HCC)   02/04/2021 Surgery   LAPAROSCOPIC  PARTIAL GASTRECTOMY by Dr Freida Busman   02/04/2021 Pathology Results   FINAL MICROSCOPIC DIAGNOSIS:   A. STOMACH MASS, PARTIAL GASTRECTOMY:  -  Gastrointestinal stromal tumor, 3.5 cm  -  Tumor extends to the cauterized resection margins  -  See oncology table and comment below   B. GASTRIC MARGIN, EXCISION:  -  No residual tumor identified     PDGFRa mutations no detected  KIT (c-KIT) Mutations:  NOT DECTECTED    01/20/2022 Imaging   EXAM: CT ABDOMEN AND PELVIS WITH CONTRAST  IMPRESSION: 1. Postsurgical change of gastric mass removal without evidence of local recurrence. 2. No evidence of metastatic disease within the abdomen or pelvis. 3. Cholelithiasis without evidence of acute inflammation. 4. Distended urinary bladder with some irregular urinary bladder wall thickening, similar to prior possibly reflecting sequela of neurogenic bladder. Consider further evaluation with cystoscopy if clinically indicated. 5. 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 6. Tubular oval fluid density focus which appears tethered to the umbilicus with a linear band extending to the urinary bladder dome measuring approximally 2.3 x 1.3 x 1.2 cm most consistent with a urachal cyst. 7. Focal subcutaneous stranding with soft tissue thickening posterior to the left greater than right femoral trochanters may reflect developing pressure wounds.      INTERVAL HISTORY:  ANTWONE CAPOZZOLI is here for a follow up of  GIST . He was last seen by me on 07/24/2022. He presents to the clinic  accompanied by parents. Pt state that he had some muscle spasm in the abdomen area, on Tuesday. Pt state the his BM is normal and denies having nausea and vomiting.       All other systems were reviewed with the patient and are negative.  MEDICAL HISTORY:  Past Medical History:  Diagnosis Date   GERD (gastroesophageal reflux  disease)    GERD (gastroesophageal reflux disease)    Seizure    unknown etiology and on keppra now. last seizure was 5 years ago as of 11/30/2020.   Spina bifida     SURGICAL HISTORY: Past Surgical History:  Procedure Laterality Date   BIOPSY  12/04/2020   Procedure: BIOPSY;  Surgeon: Lanelle Bal, DO;  Location: [facility];  Service: Endoscopy;;   BIOPSY  01/10/2021   Procedure: BIOPSY;  Surgeon: Lemar Lofty., MD;  Location[facility] ENDOSCOPY;  Service: Gastroenterology;;   ESOPHAGOGASTRODUODENOSCOPY (EGD) WITH PROPOFOL N/A 12/04/2020   Procedure: ESOPHAGOGASTRODUODENOSCOPY (EGD) WITH PROPOFOL;  Surgeon: Lanelle Bal, DO;  Location: [facility];  Service: Endoscopy;  Laterality: N/A;  9:00am   ESOPHAGOGASTRODUODENOSCOPY (EGD) WITH PROPOFOL N/A 01/10/2021   Procedure: ESOPHAGOGASTRODUODENOSCOPY (EGD) WITH PROPOFOL;  Surgeon: Meridee Score Netty Starring., MD;  Location: [facility] ENDOSCOPY;  Service: Gastroenterology;  Laterality: N/A;   EUS N/A 01/10/2021   Procedure: UPPER ENDOSCOPIC ULTRASOUND (EUS) RADIAL;  Surgeon: Lemar Lofty., MD;  Location[facility] ENDOSCOPY;  Service: Gastroenterology;  Laterality: N/A;   FINE NEEDLE ASPIRATION  01/10/2021   Procedure: FINE NEEDLE ASPIRATION (FNA) LINEAR;  Surgeon: Lemar Lofty., MD;  Location: [facility] ENDOSCOPY;  Service: Gastroenterology;;   FRACTURE SURGERY Left    metal plate placed in leg at [facility] for stabilization per mother 12/30/20   LAPAROSCOPIC PARTIAL GASTRECTOMY N/A 02/04/2021   Procedure: LAPAROSCOPIC PARTIAL GASTRECTOMY;  Surgeon: Fritzi Mandes, MD;  Location: [facility] OR;  Service: General;  Laterality: N/A;  2 HRS   SHUNT REPLACEMENT     SHUNT REVISION     SPINE SURGERY      I have reviewed the social history and family history with the patient and they are unchanged from previous note.  ALLERGIES:  is allergic to latex and amoxicillin.  MEDICATIONS:  Current Outpatient Medications  Medication Sig Dispense  Refill   acetaminophen (TYLENOL) 500 MG tablet Take 2 tablets (1,000 mg total) by mouth every 8 (eight) hours as needed for mild pain. 30 tablet 0   Calcium Carbonate-Vitamin D 600-200 MG-UNIT TABS Take 1 tablet by mouth in the morning.     Cholecalciferol (VITAMIN D3) 50 MCG (2000 UT) TABS Take 2,000 Units by mouth in the morning.     imipramine (TOFRANIL) 50 MG tablet Take 50 mg by mouth at bedtime.     levETIRAcetam (KEPPRA) 500 MG tablet Take 250-500 mg by mouth See admin instructions. Take 0.5 tablet (250 mg) by mouth in the morning & take 1 tablet (500 mg) by mouth at night.     Multiple Vitamin (MULTIVITAMIN WITH MINERALS) TABS tablet Take 1 tablet by mouth in the morning.     omeprazole (PRILOSEC) 20 MG capsule TAKE 1 CAPSULE BY MOUTH TWICE DAILY BEFORE A MEALS. 60 capsule 11   oxybutynin (DITROPAN XL) 15 MG 24 hr tablet Take 15 mg by mouth in the morning.     oxybutynin (DITROPAN) 5 MG tablet Take 5 mg by mouth at bedtime.     oxyCODONE (OXY IR/ROXICODONE) 5 MG immediate release tablet Take 1 tablet (5 mg total)  by mouth every 6 (six) hours as needed for severe pain. 15 tablet 0   Probiotic Product (PROBIOTIC ADVANCED PO) Take 1 capsule by mouth in the morning.     sertraline (ZOLOFT) 100 MG tablet Take 100 mg by mouth at bedtime.     vitamin C (ASCORBIC ACID) 500 MG tablet Take 500 mg by mouth in the morning.     No current facility-administered medications for this visit.    PHYSICAL EXAMINATION: ECOG PERFORMANCE STATUS: 2 - Symptomatic, <50% confined to bed  Vitals:   01/28/23 1106  BP: (!) 132/93  Pulse: 99  Resp: 18  Temp: 98 F (36.7 C)  SpO2: 100%   Wt Readings from Last 3 Encounters:  01/28/23 132 lb 6.4 oz (60.1 kg)  07/24/22 137 lb 9.6 oz (62.4 kg)  01/22/22 139 lb 4.8 oz (63.2 kg)    GENERAL:alert, no distress and comfortable SKIN: skin color, texture, turgor are normal, no rashes or significant lesions EYES: normal, Conjunctiva are pink and non-injected,  sclera clear ABDOMEN:(-) abdomen soft,(-) non-tender and normal bowel sounds   ABORATORY DATA:  I have reviewed the data as listed    Latest Ref Rng & Units 01/26/2023   11:15 AM 07/24/2022   12:27 PM 01/20/2022   12:11 PM  CBC  WBC 4.0 - 10.5 K/uL 5.2  5.1  5.6   Hemoglobin 13.0 - 17.0 g/dL 16.1  09.6  04.5   Hematocrit 39.0 - 52.0 % 42.8  42.8  41.2   Platelets 150 - 400 K/uL 234  219  230         Latest Ref Rng & Units 01/26/2023   11:15 AM 07/24/2022   12:27 PM 01/20/2022   12:11 PM  CMP  Glucose 70 - 99 mg/dL 89  409  811   BUN 6 - 20 mg/dL 13  13  12    Creatinine 0.61 - 1.24 mg/dL 9.14  7.82  9.56   Sodium 135 - 145 mmol/L 141  140  140   Potassium 3.5 - 5.1 mmol/L 3.9  3.7  3.9   Chloride 98 - 111 mmol/L 102  102  102   CO2 22 - 32 mmol/L 32  33  31   Calcium 8.9 - 10.3 mg/dL 9.6  9.1  9.3   Total Protein 6.5 - 8.1 g/dL 7.9  7.8  7.5   Total Bilirubin 0.3 - 1.2 mg/dL 0.4  0.4  0.5   Alkaline Phos 38 - 126 U/L 101  99  109   AST 15 - 41 U/L 23  19  18    ALT 0 - 44 U/L 21  21  16        RADIOGRAPHIC STUDIES: I have personally reviewed the radiological images as listed and agreed with the findings in the report. CT ABDOMEN PELVIS W CONTRAST  Result Date: 01/28/2023 CLINICAL DATA:  GI stromal tumor of the stomach. Surveillance. Rule out recurrence. * Tracking Code: BO * EXAM: CT ABDOMEN AND PELVIS WITH CONTRAST TECHNIQUE: Multidetector CT imaging of the abdomen and pelvis was performed using the standard protocol following bolus administration of intravenous contrast. RADIATION DOSE REDUCTION: This exam was performed according to the departmental dose-optimization program which includes automated exposure control, adjustment of the mA and/or kV according to patient size and/or use of iterative reconstruction technique. CONTRAST:  80mL OMNIPAQUE IOHEXOL 300 MG/ML  SOLN COMPARISON:  01/20/2022 FINDINGS: Lower Chest: No acute findings. Hepatobiliary: A few tiny sub-cm cysts  again seen in the  posterior right hepatic lobe, best visualized on coronal series 5, and without significant change since previous study. No suspicious hepatic masses identified. Numerous tiny calcified gallstones are again seen, however there is no evidence of cholecystitis or biliary ductal dilatation. VP shunt catheter again seen in the right perihepatic space, and no abnormal fluid collections are identified. Pancreas:  No mass or inflammatory changes. Spleen: Within normal limits in size and appearance. Adrenals/Urinary Tract: No suspicious masses identified. No evidence of ureteral calculi or hydronephrosis. Distended urinary bladder with mild wall thickening and trabeculation is unchanged, and may be due to chronic bladder outlet obstruction or neurogenic bladder. Stomach/Bowel: Surgical staples again seen in the gastric body. No recurrent mass visualized. No evidence of obstruction, inflammatory process or abnormal fluid collections. Normal appendix visualized. Vascular/Lymphatic: No pathologically enlarged lymph nodes. No acute vascular findings. Reproductive:  No mass or other significant abnormality. Other:  Moran. Musculoskeletal:  No suspicious bone lesions identified. IMPRESSION: Stable exam. No evidence of recurrent or metastatic disease within the abdomen or pelvis. Cholelithiasis. No radiographic evidence of cholecystitis. Stable distended urinary bladder with mild wall thickening and trabeculation, which may be due to chronic bladder outlet obstruction or neurogenic bladder. Electronically Signed   By: Danae Orleans M.D.   On: 01/28/2023 08:26      No orders of the defined types were placed in this encounter.  All questions were answered. The patient knows to call the clinic with any problems, questions or concerns. No barriers to learning was detected. The total time spent in the appointment was 20 minutes.     Malachy Mood, MD 01/28/2023   Carolin Coy, CMA, am acting as scribe for  Malachy Mood, MD.   I have reviewed the above documentation for accuracy and completeness, and I agree with the above.

## 2023-01-28 ENCOUNTER — Other Ambulatory Visit: Payer: Self-pay

## 2023-01-28 ENCOUNTER — Inpatient Hospital Stay (HOSPITAL_BASED_OUTPATIENT_CLINIC_OR_DEPARTMENT_OTHER): Payer: Medicare Other | Admitting: Hematology

## 2023-01-28 ENCOUNTER — Encounter: Payer: Self-pay | Admitting: Hematology

## 2023-01-28 VITALS — BP 132/93 | HR 99 | Temp 98.0°F | Resp 18 | Ht 63.0 in | Wt 132.4 lb

## 2023-01-28 DIAGNOSIS — C49A2 Gastrointestinal stromal tumor of stomach: Secondary | ICD-10-CM | POA: Diagnosis not present

## 2023-01-28 DIAGNOSIS — Z08 Encounter for follow-up examination after completed treatment for malignant neoplasm: Secondary | ICD-10-CM | POA: Diagnosis present

## 2023-01-28 DIAGNOSIS — Z85028 Personal history of other malignant neoplasm of stomach: Secondary | ICD-10-CM | POA: Diagnosis present

## 2023-01-30 NOTE — Progress Notes (Signed)
Pt's recent CT Scan results, labs, and Dr. Latanya Maudlin last office note faxed to Erskine Speed, FNP w/[facility] in [place] [phone]).  Fax confirmation received.

## 2023-05-04 IMAGING — CT CT ABD-PELV W/ CM
3 of 5 series · 11 of 46 positions shown, 16 images · IV contrast (APPLIED)
Comparison: CT November 27, 2020 and January 22, 2021

CLINICAL DATA: Gastrointestinal stromal tumor status post
resection, surveillance. No chemotherapy.

* Tracking Code: BO *
EXAM:
CT ABDOMEN AND PELVIS WITH CONTRAST
TECHNIQUE: Multidetector CT imaging of the abdomen and pelvis was performed
using the standard protocol following bolus administration of
intravenous contrast.

[Series 3: axial st · axial · 0.90mm/px · z∈[-328,+12]mm · 7 of 92 slices shown, 12 images]
[im 12/92  soft-tissue]
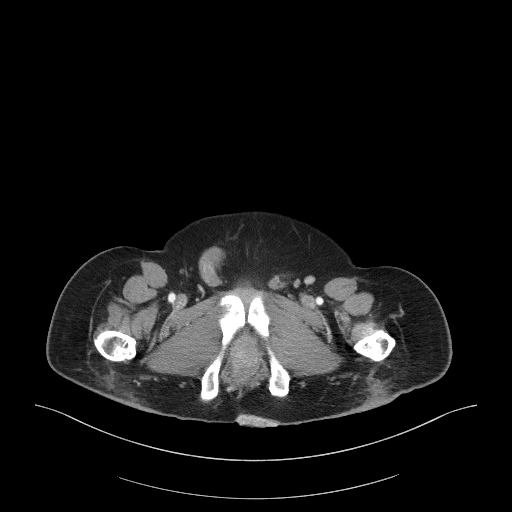
[im 12/92  bone]
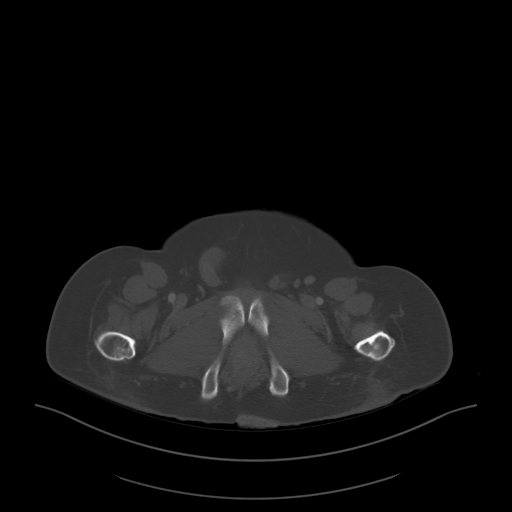
[im 23/92  soft-tissue]
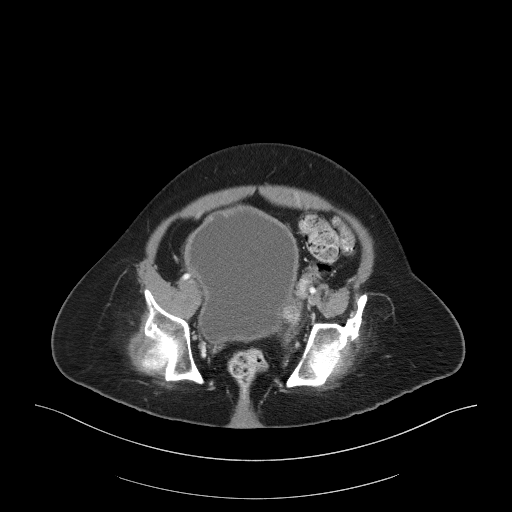
[im 35/92  soft-tissue]
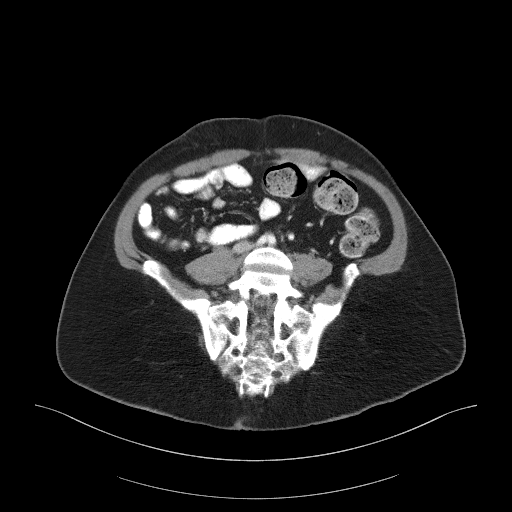
[im 46/92  soft-tissue]
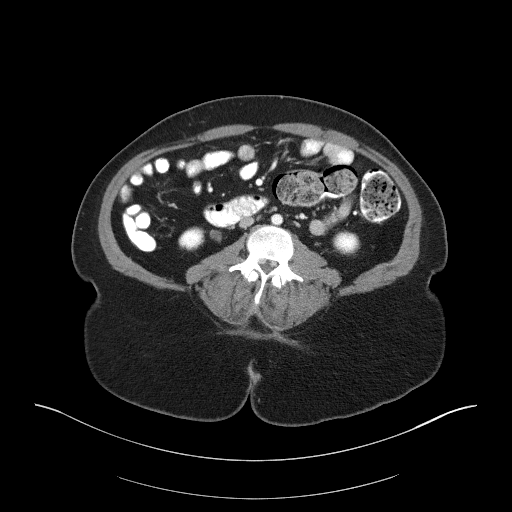
[im 46/92  lung]
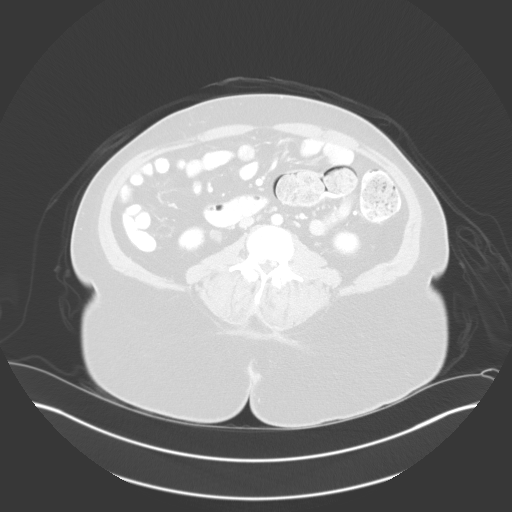
[im 57/92  soft-tissue]
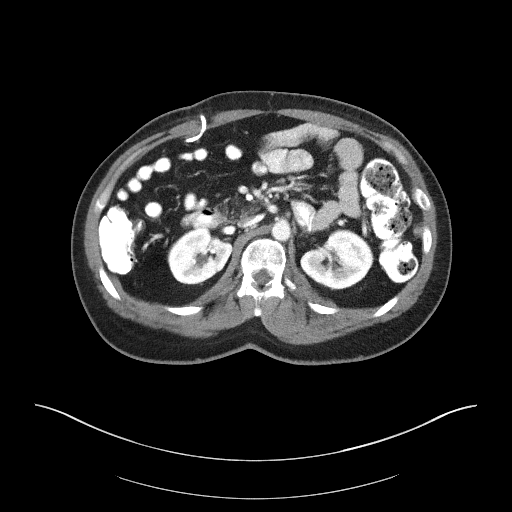
[im 57/92  lung]
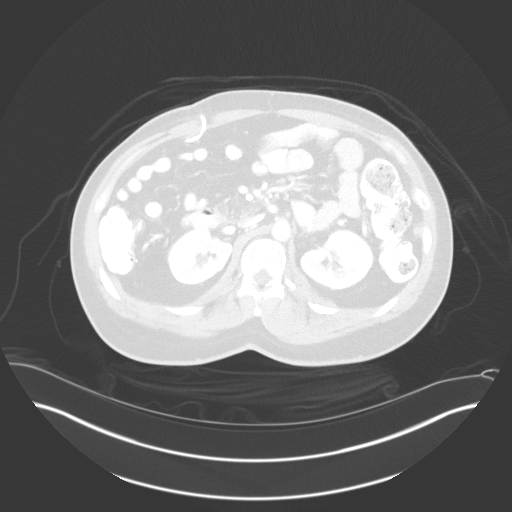
[im 69/92  soft-tissue]
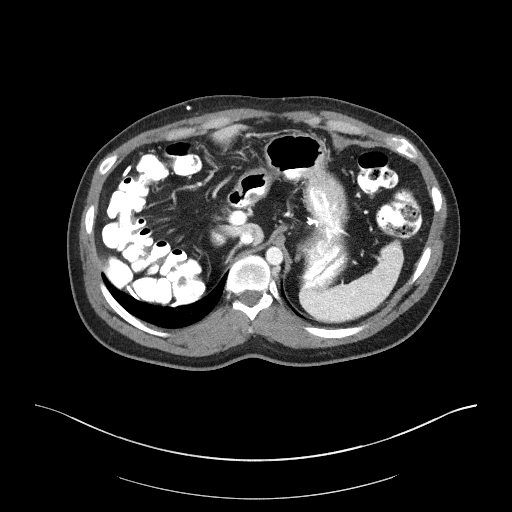
[im 69/92  lung]
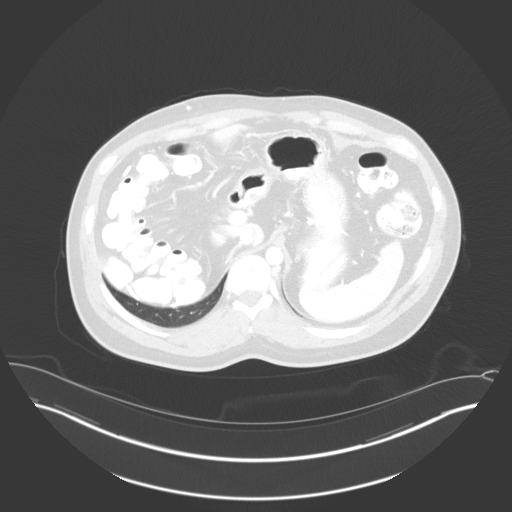
[im 80/92  soft-tissue]
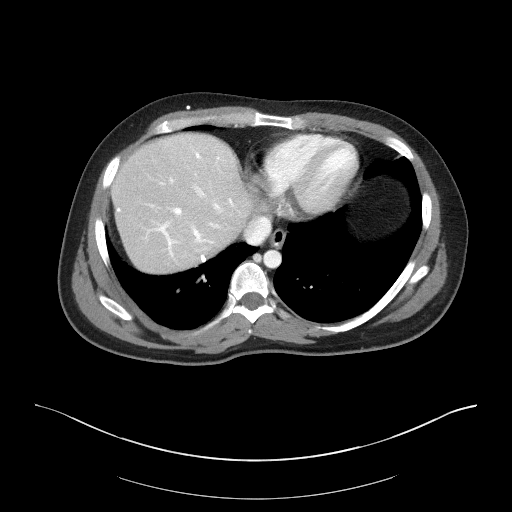
[im 80/92  lung]
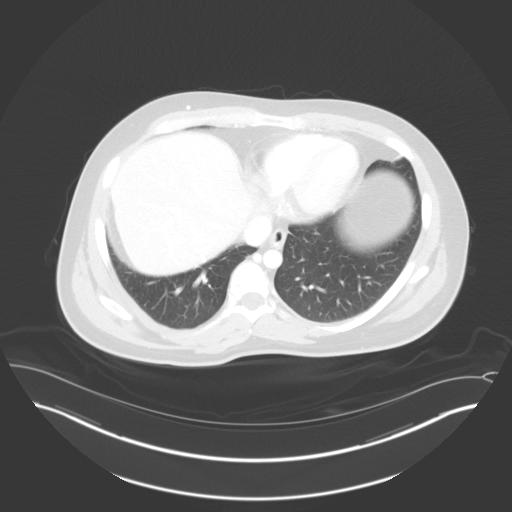

[Series 6: coronal st · coronal · 0.75mm/px · 3 of 81 slices shown]
[im 27/81  soft-tissue]
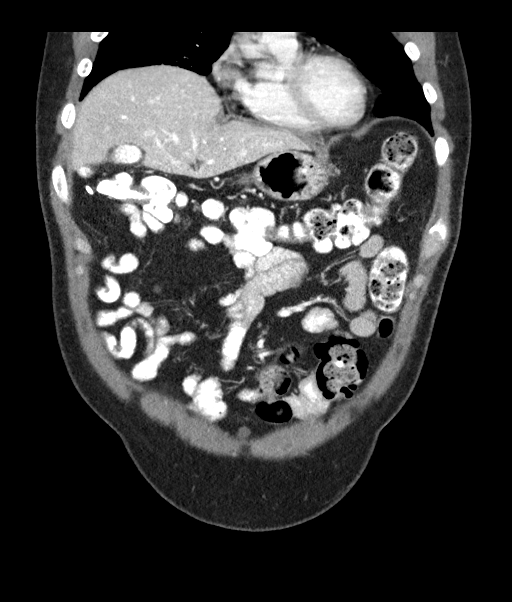
[im 36/81  soft-tissue]
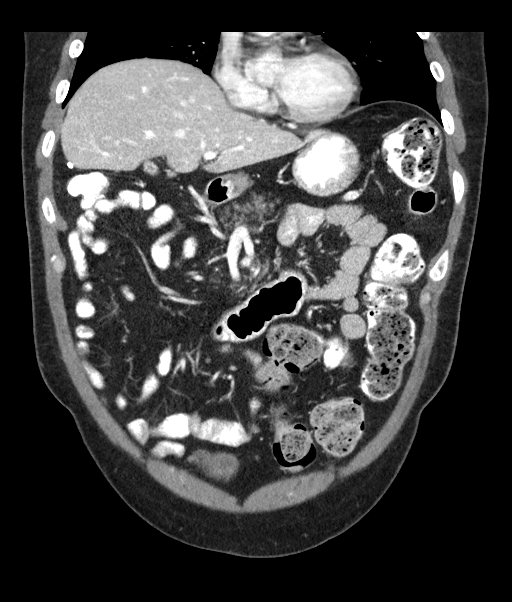
[im 45/81  soft-tissue]
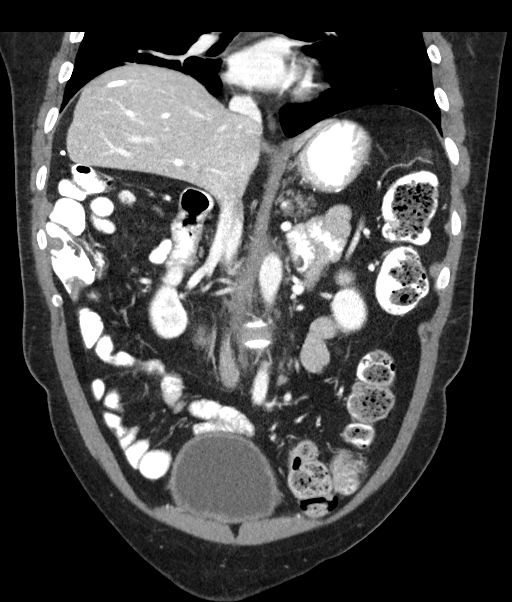

[Series 8: sagittal st · sagittal · 0.63mm/px · 1 of 114 slices shown]
[im 38/114  soft-tissue]
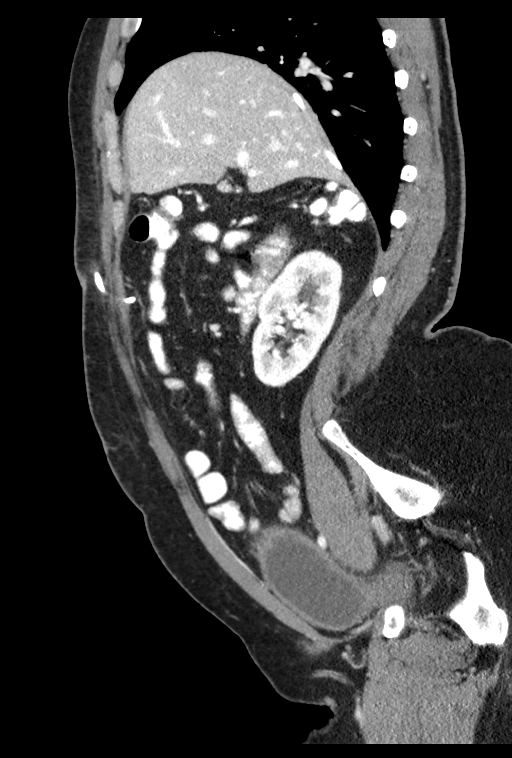

[11 of 46 positions shown; findings below may reference images not displayed]

RADIATION DOSE REDUCTION: This exam was performed according to the
departmental dose-optimization program which includes automated
exposure control, adjustment of the mA and/or kV according to
patient size and/or use of iterative reconstruction technique.

CONTRAST:  100mL OMNIPAQUE IOHEXOL 300 MG/ML  SOLN
FINDINGS: Lower chest: No acute abnormality.

Hepatobiliary: Tiny hypodense lesions in the hepatic dome measure up
to 5 mm on image [DATE] are technically too small to accurately
characterize but stable dating back to November 27, 2020 and in the
absence of concurrent chemotherapy are most consistent with a benign
etiology such as cysts but are technically indeterminate.
Gallbladder is decompressed around numerous cholelithiasis without
findings of acute inflammation. No biliary ductal dilation.

Pancreas: No pancreatic ductal dilation or evidence of acute
inflammation.

Spleen: No splenomegaly or focal splenic lesion.

Adrenals/Urinary Tract: Bilateral adrenal glands appear normal.

No hydronephrosis.  No solid enhancing renal mass.

Distended urinary bladder with some irregular urinary bladder wall
thickening for instance on image 68/3, similar to prior possibly
reflecting sequela of neurogenic bladder. Blind-ending focal
dilation of the urinary bladder which appears connected to the
urachus likely reflects a vesicourachal diverticulum. Additionally,
there is a tubular oval fluid density which appears tethered to the
umbilicus with a extension to the urinary bladder dome measuring
approximally 2.3 by 1.3 x 1.2 cm on images 55/8 and 62/3 most
consistent with a urachal cyst.

Stomach/Bowel: Radiopaque enteric contrast material traverses the
rectum. Postsurgical change of gastric mass removal without evidence
of local recurrence. No pathologic dilation of small or large bowel.
The appendix and terminal ileum appear normal. No evidence of acute
bowel inflammation.

Vascular/Lymphatic: Normal caliber abdominal aorta. No
pathologically enlarged abdominal or pelvic lymph nodes.

Reproductive: Prostate is unremarkable.

Other: Partially visualized VP shunt catheter tubing with tip coiled
near the hepatic dome, unchanged from prior. No significant
abdominopelvic free fluid.

Musculoskeletal: No aggressive lytic or blastic lesion of bone. Left
femoral fixation hardware. Focal subcutaneous stranding with soft
tissue thickening posterior to the left greater than right greater
trochanters for instance on image 81/3 and image [DATE] reflect
developing pressure wounds.
IMPRESSION: 1. Postsurgical change of gastric mass removal without evidence of
local recurrence.
2. No evidence of metastatic disease within the abdomen or pelvis.
3. Cholelithiasis without evidence of acute inflammation.
4. Distended urinary bladder with some irregular urinary bladder
wall thickening, similar to prior possibly reflecting sequela of
neurogenic bladder. Consider further evaluation with cystoscopy if
clinically indicated.
5. Blind-ending focal dilation of the urinary bladder which appears
connected to the urachus likely reflects a vesicourachal
diverticulum.
6. Tubular oval fluid density focus which appears tethered to the
umbilicus with a linear band extending to the urinary bladder dome
measuring approximally 2.3 x 1.3 x 1.2 cm most consistent with a
urachal cyst.
7. Focal subcutaneous stranding with soft tissue thickening
posterior to the left greater than right femoral trochanters may
reflect developing pressure wounds.

## 2023-07-29 ENCOUNTER — Other Ambulatory Visit: Payer: Self-pay

## 2023-07-29 DIAGNOSIS — C49A2 Gastrointestinal stromal tumor of stomach: Secondary | ICD-10-CM

## 2023-07-29 NOTE — Assessment & Plan Note (Signed)
pT2N0M0 stage IA -incidental findings during work up his LE vascular issues on CT 11/27/20  -endoscopy with Dr Meridee Score on 01/10/21, biopsy showed neoplastic cells consistent with GIST.  -CT chest 01/22/21 was negative. -s/p surgical resection with Dr Freida Busman on 02/04/21, path showed 3.5cm GIST tumor, clear margins and mitotic rate of 6. KIT and PDGFRa mutations not detected.  -he has intermediate risk for recurrence, due to the negative kit and PDGFR mutation, adjuvant imatinib was not recommended. -restaging CT [facility] on 01/26/2023 showed NED.  Will continue surveillance.  Plan to continue screening CT scan annually until 5 years.

## 2023-07-30 ENCOUNTER — Inpatient Hospital Stay (HOSPITAL_BASED_OUTPATIENT_CLINIC_OR_DEPARTMENT_OTHER): Payer: Medicare Other | Admitting: Hematology

## 2023-07-30 ENCOUNTER — Encounter: Payer: Self-pay | Admitting: Hematology

## 2023-07-30 ENCOUNTER — Inpatient Hospital Stay: Payer: Medicare Other | Attending: Hematology

## 2023-07-30 VITALS — BP 131/89 | HR 111 | Temp 98.6°F | Resp 18 | Ht 63.0 in | Wt 129.9 lb

## 2023-07-30 DIAGNOSIS — K8020 Calculus of gallbladder without cholecystitis without obstruction: Secondary | ICD-10-CM | POA: Diagnosis not present

## 2023-07-30 DIAGNOSIS — C49A2 Gastrointestinal stromal tumor of stomach: Secondary | ICD-10-CM | POA: Insufficient documentation

## 2023-07-30 DIAGNOSIS — Z880 Allergy status to penicillin: Secondary | ICD-10-CM | POA: Insufficient documentation

## 2023-07-30 DIAGNOSIS — Q059 Spina bifida, unspecified: Secondary | ICD-10-CM | POA: Insufficient documentation

## 2023-07-30 DIAGNOSIS — R634 Abnormal weight loss: Secondary | ICD-10-CM | POA: Insufficient documentation

## 2023-07-30 DIAGNOSIS — K2950 Unspecified chronic gastritis without bleeding: Secondary | ICD-10-CM | POA: Diagnosis not present

## 2023-07-30 DIAGNOSIS — Z79899 Other long term (current) drug therapy: Secondary | ICD-10-CM | POA: Diagnosis not present

## 2023-07-30 LAB — CBC WITH DIFFERENTIAL (CANCER CENTER ONLY)
Abs Immature Granulocytes: 0.01 10*3/uL (ref 0.00–0.07)
Basophils Absolute: 0 10*3/uL (ref 0.0–0.1)
Basophils Relative: 1 %
Eosinophils Absolute: 0.3 10*3/uL (ref 0.0–0.5)
Eosinophils Relative: 6 %
HCT: 41.3 % (ref 39.0–52.0)
Hemoglobin: 14 g/dL (ref 13.0–17.0)
Immature Granulocytes: 0 %
Lymphocytes Relative: 21 %
Lymphs Abs: 1.1 10*3/uL (ref 0.7–4.0)
MCH: 30.4 pg (ref 26.0–34.0)
MCHC: 33.9 g/dL (ref 30.0–36.0)
MCV: 89.6 fL (ref 80.0–100.0)
Monocytes Absolute: 0.4 10*3/uL (ref 0.1–1.0)
Monocytes Relative: 9 %
Neutro Abs: 3.2 10*3/uL (ref 1.7–7.7)
Neutrophils Relative %: 63 %
Platelet Count: 185 10*3/uL (ref 150–400)
RBC: 4.61 MIL/uL (ref 4.22–5.81)
RDW: 12.9 % (ref 11.5–15.5)
WBC Count: 5 10*3/uL (ref 4.0–10.5)
nRBC: 0 % (ref 0.0–0.2)

## 2023-07-30 LAB — CMP (CANCER CENTER ONLY)
ALT: 19 U/L (ref 0–44)
AST: 20 U/L (ref 15–41)
Albumin: 4.3 g/dL (ref 3.5–5.0)
Alkaline Phosphatase: 99 U/L (ref 38–126)
Anion gap: 7 (ref 5–15)
BUN: 8 mg/dL (ref 6–20)
CO2: 32 mmol/L (ref 22–32)
Calcium: 9.5 mg/dL (ref 8.9–10.3)
Chloride: 102 mmol/L (ref 98–111)
Creatinine: 0.71 mg/dL (ref 0.61–1.24)
GFR, Estimated: >60 mL/min (ref >60)
Glucose, Bld: 104 mg/dL — ABNORMAL HIGH (ref 70–99)
Potassium: 3.9 mmol/L (ref 3.5–5.1)
Sodium: 141 mmol/L (ref 135–145)
Total Bilirubin: 0.3 mg/dL (ref 0.3–1.2)
Total Protein: 7.5 g/dL (ref 6.5–8.1)

## 2023-07-30 NOTE — Progress Notes (Signed)
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  -CT chest 01/22/21 was negative. -s/p surgical resection with Dr Freida Busman on 02/04/21, path showed 3.5cm GIST tumor, clear margins and mitotic rate of 6. KIT and PDGFRa mutations not detected.  -he has intermediate risk for recurrence, due to the negative kit and PDGFR mutation, adjuvant imatinib was not recommended. -restaging CT [facility] on 01/26/2023 showed NED.  Will continue surveillance.  Plan to continue screening CT scan annually until 5 years.     Assessment and Plan    Gastric GIST  Patient is 2.5 years post-diagnosis with intermediate risk of recurrence. No current clinical concerns. Labs and physical exam are normal. -Order CT scan and labs to be done 1 week prior to next appointment in April 2025. -Continue to monitor for any new symptoms, especially abdominal pain, bloating, or significant weight loss.  General Health Maintenance Received COVID-19 vaccine recently. Plan to receive flu vaccine in 2 weeks. -Continue with planned vaccinations.  Nutrition Patient has lost some weight, possibly due to reduced carbohydrate intake. No signs of malnutrition or significant weight loss. -Encourage balanced diet. Monitor  weight.      Plan -He is clinically doing well, no concern for recurrence -Follow-up in 6 months with lab and CT abdomen pelvis with contrast 1 week before     SUMMARY OF ONCOLOGIC HISTORY: Oncology History Overview Note  Cancer Staging Gastrointestinal stromal tumor (GIST) of stomach (HCC) Staging form: Gastrointestinal Stromal Tumor - Gastric and Omental GIST, AJCC 8th Edition - Clinical stage from 01/10/2021: Stage IA (cT2, cN0, cM0, Mitotic Rate: Low) - Signed by Malachy Mood, MD on 01/23/2021 Stage prefix: Initial diagnosis Histologic grade (G): Low grade Histologic grading system: 2 grade system    Gastrointestinal stromal tumor (GIST) of stomach (HCC)  11/27/2020 Imaging   CT Angio 11/27/20  IMPRESSION: 1. No acute arterial abnormality detected. However, evaluation below the knees was somewhat limited by contrast timing. 2. Cholelithiasis without acute inflammation. 3. Distended urinary bladder with some mild bladder wall thickening. Correlate with urinalysis. Findings are suggestive of a neurogenic bladder. 4. Exophytic masslike area arising from the greater curvature of the gastric body. This is concerning for a malignancy such as a GIST. Outpatient GI follow-up is recommended.   12/04/2020 Procedure   Upper Endoscopy by Dr Marletta Lor 12/04/20  IMPRESSION - Rule out malignancy, gastric tumor on the greater curvature of the stomach. Biopsied. - Gastritis. Biopsied. - Normal duodenal bulb, first portion of the duodenum and second portion of the duodenum.  FINAL MICROSCOPIC DIAGNOSIS: 12/04/20 A. STOMACH, MASS, BIOPSY:  - Gastric oxyntic mucosa with mild chronic gastritis  - Negative for intestinal metaplasia, dysplasia or malignancy  - No submucosa is present for evaluation   B. STOMACH, ANTRUM AND BODY, BIOPSY:  - Gastric antral and oxyntic mucosa with mild chronic gastritis  -  Gastric oxyntic mucosa with parietal cell hyperplasia as can be seen  in hypergastrinemic  states such as PPI therapy.  - Warthin Starry stain is negative for Helicobacter pylori    01/10/2021 Procedure   Upper EUS by Dr Meridee Score 01/10/21 EGD Impression: - No gross lesions in esophagus. Z-line regular, 36 cm from the incisors. - Gastritis. Biopsied. - Rule out malignancy, gastric subepithelial lesion on the greater curvature of the stomach was noted. - No gross lesions in the duodenal bulb, in the first portion of the duodenum and in the second portion of the duodenum. EUS Impression: - An intramural (subepithelial) lesion was found in the greater curve of the stomach. The lesion appeared to originate from within the muscularis propria (Layer 4). Cytology results are pending. However, the endosonographic appearance is highly suspicious for a stromal cell (smooth muscle) neoplasm. Fine needle biopsy performed. - No malignant-appearing lymph nodes were visualized in the celiac region (level 20), perigastric region and peripancreatic region.    FINAL MICROSCOPIC DIAGNOSIS: 01/10/21 A. STOMACH, BIOPSY:  - Gastric antral and oxyntic mucosa with slight chronic inflammation.  - Warthin-Starry negative for Helicobacter pylori.  - No intestinal metaplasia, dysplasia, GIST or carcinoma.     01/10/2021 Initial Biopsy   Initial Biopsy 01/10/21 FINAL MICROSCOPIC DIAGNOSIS:  A. STOMACH, FINE NEEDLE ASPIRATION:  - Neoplastic cells consistent with gastrointestinal stromal tumor  (GIST).  - See comment.   COMMENT:  The neoplastic cells have epithelioid morphology.  Immunohistochemistry  shows strong positivity with CD117 (c-kit) and CD34.  The neoplastic  cells are negative with cytokeratin AE1/AE3, CD56, chromogranin and  synaptophysin.  Immunohistochemistry with Ki-67 shows a low  proliferation rate.  The findings are consistent with gastrointestinal  stromal tumor (GIST) with epithelioid features.    01/10/2021 Cancer Staging   Staging form: Gastrointestinal Stromal Tumor -  Gastric and Omental GIST, AJCC 8th Edition - Clinical stage from 01/10/2021: Stage IA (cT2, cN0, cM0, Mitotic Rate: Low) - Signed by Malachy Mood, MD on 01/23/2021 Stage prefix: Initial diagnosis Histologic grade (G): Low grade Histologic grading system: 2 grade system   01/22/2021 Imaging   CT Chest  IMPRESSION: 1. No pulmonary metastasis. 2. No mediastinal nodal metastasis. 3. Partially exophytic submucosal lesion extending from the greater curvature measures up to 3.5 cm. No upper abdominal adenopathy.     01/23/2021 Initial Diagnosis   Gastrointestinal stromal tumor (GIST) of stomach (HCC)   02/04/2021 Surgery   LAPAROSCOPIC PARTIAL GASTRECTOMY by Dr Freida Busman   02/04/2021 Pathology Results   FINAL MICROSCOPIC DIAGNOSIS:   A. STOMACH MASS, PARTIAL GASTRECTOMY:  -  Gastrointestinal stromal tumor, 3.5 cm  -  Tumor extends to the cauterized resection margins  -  See oncology table and comment below   B. GASTRIC MARGIN, EXCISION:  -  No residual tumor identified     PDGFRa mutations no detected  KIT (c-KIT) Mutations:  NOT DECTECTED    01/20/2022 Imaging   EXAM: CT ABDOMEN AND PELVIS WITH CONTRAST  IMPRESSION: 1. Postsurgical change of gastric mass removal without evidence of local recurrence. 2. No evidence of metastatic disease within the abdomen or pelvis. 3. Cholelithiasis without evidence of acute inflammation. 4. Distended urinary bladder with some irregular urinary bladder wall thickening, similar to prior possibly reflecting sequela of neurogenic bladder. Consider further evaluation with cystoscopy if clinically indicated. 5. 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 6. Tubular oval fluid density focus which appears tethered to  the umbilicus with a linear band extending to the urinary bladder dome measuring approximally 2.3 x 1.3 x 1.2 cm most consistent with a urachal  cyst. 7. Focal subcutaneous stranding with soft tissue thickening posterior to the left greater than right femoral trochanters may reflect developing pressure wounds.      Discussed the use of AI scribe software for clinical note transcription with the patient, who gave verbal consent to proceed.  History of Present Illness   A 47 year old male patient presents for a follow-up visit regarding a chest condition. The patient recently received a COVID-19 vaccine and reports no significant adverse effects. The patient's weight has slightly decreased from around 135 pounds to 130 pounds, but his appetite remains good. The patient denies any stomach pain or bloating and reports normal bowel movements. The patient's energy and activity levels remain consistent. The patient is due for a flu shot in about two weeks.         All other systems were reviewed with the patient and are negative.  MEDICAL HISTORY:  Past Medical History:  Diagnosis Date   GERD (gastroesophageal reflux disease)    GERD (gastroesophageal reflux disease)    Seizure (HCC)    unknown etiology and on keppra now. last seizure was 5 years ago as of 11/30/2020.   Spina bifida [facility])     SURGICAL HISTORY: Past Surgical History:  Procedure Laterality Date   BIOPSY  12/04/2020   Procedure: BIOPSY;  Surgeon: Lanelle Bal, DO;  Location: [facility];  Service: Endoscopy;;   BIOPSY  01/10/2021   Procedure: BIOPSY;  Surgeon: Lemar Lofty., MD;  Location: [facility] ENDOSCOPY;  Service: Gastroenterology;;   ESOPHAGOGASTRODUODENOSCOPY (EGD) WITH PROPOFOL N/A 12/04/2020   Procedure: ESOPHAGOGASTRODUODENOSCOPY (EGD) WITH PROPOFOL;  Surgeon: Lanelle Bal, DO;  Location: [facility];  Service: Endoscopy;  Laterality: N/A;  9:00am   ESOPHAGOGASTRODUODENOSCOPY (EGD) WITH PROPOFOL N/A 01/10/2021   Procedure: ESOPHAGOGASTRODUODENOSCOPY (EGD) WITH PROPOFOL;  Surgeon: Meridee Score Netty Starring., MD;  Location: [facility] ENDOSCOPY;   Service: Gastroenterology;  Laterality: N/A;   EUS N/A 01/10/2021   Procedure: UPPER ENDOSCOPIC ULTRASOUND (EUS) RADIAL;  Surgeon: Lemar Lofty., MD;  Location: [facility] ENDOSCOPY;  Service: Gastroenterology;  Laterality: N/A;   FINE NEEDLE ASPIRATION  01/10/2021   Procedure: FINE NEEDLE ASPIRATION (FNA) LINEAR;  Surgeon: Lemar Lofty., MD;  Location: [facility] ENDOSCOPY;  Service: Gastroenterology;;   FRACTURE SURGERY Left    metal plate placed in leg at [facility] for stabilization per mother 12/30/20   LAPAROSCOPIC PARTIAL GASTRECTOMY N/A 02/04/2021   Procedure: LAPAROSCOPIC PARTIAL GASTRECTOMY;  Surgeon: Fritzi Mandes, MD;  Location: [facility] OR;  Service: General;  Laterality: N/A;  2 HRS   SHUNT REPLACEMENT     SHUNT REVISION     SPINE SURGERY      I have reviewed the social history and family history with the patient and they are unchanged from previous note.  ALLERGIES:  is allergic to latex and amoxicillin.  MEDICATIONS:  Current Outpatient Medications  Medication Sig Dispense Refill   acetaminophen (TYLENOL) 500 MG tablet Take 2 tablets (1,000 mg total) by mouth every 8 (eight) hours as needed for mild pain. 30 tablet 0   Calcium Carbonate-Vitamin D 600-200 MG-UNIT TABS Take 1 tablet by mouth in the morning.     Cholecalciferol (VITAMIN D3) 50 MCG (2000 UT) TABS Take 2,000 Units by mouth in the morning.     imipramine (TOFRANIL) 50 MG tablet Take 50 mg by mouth at  bedtime.     levETIRAcetam (KEPPRA) 500 MG tablet Take 250-500 mg by mouth See admin instructions. Take 0.5 tablet (250 mg) by mouth in the morning & take 1 tablet (500 mg) by mouth at night.     Multiple Vitamin (MULTIVITAMIN WITH MINERALS) TABS tablet Take 1 tablet by mouth in the morning.     omeprazole (PRILOSEC) 20 MG capsule TAKE 1 CAPSULE BY MOUTH TWICE DAILY BEFORE A MEALS. 60 capsule 11   oxybutynin (DITROPAN XL) 15 MG 24 hr tablet Take 15 mg by mouth in the morning.     oxybutynin (DITROPAN) 5 MG tablet  Take 5 mg by mouth at bedtime.     oxyCODONE (OXY IR/ROXICODONE) 5 MG immediate release tablet Take 1 tablet (5 mg total) by mouth every 6 (six) hours as needed for severe pain. 15 tablet 0   Probiotic Product (PROBIOTIC ADVANCED PO) Take 1 capsule by mouth in the morning.     sertraline (ZOLOFT) 100 MG tablet Take 100 mg by mouth at bedtime.     vitamin C (ASCORBIC ACID) 500 MG tablet Take 500 mg by mouth in the morning.     No current facility-administered medications for this visit.    PHYSICAL EXAMINATION: ECOG PERFORMANCE STATUS: 1 - Symptomatic but completely ambulatory  Vitals:   07/30/23 1308  BP: 131/89  Pulse: (!) 111  Resp: 18  Temp: 98.6 F (37 C)  SpO2: 98%   Wt Readings from Last 3 Encounters:  07/30/23 129 lb 14.4 oz (58.9 kg)  01/28/23 132 lb 6.4 oz (60.1 kg)  07/24/22 137 lb 9.6 oz (62.4 kg)     GENERAL:alert, no distress and comfortable SKIN: skin color, texture, turgor are normal, no rashes or significant lesions EYES: normal, Conjunctiva are pink and non-injected, sclera clear NECK: supple, thyroid normal size, non-tender, without nodularity LYMPH:  no palpable lymphadenopathy in the cervical, axillary  LUNGS: clear to auscultation and percussion with normal breathing effort HEART: regular rate & rhythm and no murmurs and no lower extremity edema ABDOMEN:abdomen soft, non-tender and normal bowel sounds Musculoskeletal:no cyanosis of digits and no clubbing  NEURO: alert & oriented x 3 with fluent speech, no focal motor/sensory deficits   LABORATORY DATA:  I have reviewed the data as listed    Latest Ref Rng & Units 07/30/2023   12:13 PM 01/26/2023   11:15 AM 07/24/2022   12:27 PM  CBC  WBC 4.0 - 10.5 K/uL 5.0  5.2  5.1   Hemoglobin 13.0 - 17.0 g/dL 57.8  46.9  62.9   Hematocrit 39.0 - 52.0 % 41.3  42.8  42.8   Platelets 150 - 400 K/uL 185  234  219         Latest Ref Rng & Units 07/30/2023   12:13 PM 01/26/2023   11:15 AM 07/24/2022   12:27  PM  CMP  Glucose 70 - 99 mg/dL 528  89  413   BUN 6 - 20 mg/dL 8  13  13    Creatinine 0.61 - 1.24 mg/dL 2.44  0.10  2.72   Sodium 135 - 145 mmol/L 141  141  140   Potassium 3.5 - 5.1 mmol/L 3.9  3.9  3.7   Chloride 98 - 111 mmol/L 102  102  102   CO2 22 - 32 mmol/L 32  32  33   Calcium 8.9 - 10.3 mg/dL 9.5  9.6  9.1   Total Protein 6.5 - 8.1 g/dL 7.5  7.9  7.8  Total Bilirubin 0.3 - 1.2 mg/dL 0.3  0.4  0.4   Alkaline Phos 38 - 126 U/L 99  101  99   AST 15 - 41 U/L 20  23  19    ALT 0 - 44 U/L 19  21  21        RADIOGRAPHIC STUDIES: I have personally reviewed the radiological images as listed and agreed with the findings in the report. No results found.    Orders Placed This Encounter  Procedures   CT ABDOMEN PELVIS W CONTRAST    Standing Status:   Future    Standing Expiration Date:   07/29/2024    Order Specific Question:   If indicated for the ordered procedure, I authorize the administration of contrast media per Radiology protocol    Answer:   Yes    Order Specific Question:   Does the patient have a contrast media/X-ray dye allergy?    Answer:   No    Order Specific Question:   Preferred imaging location?    Answer:   [facility]    Order Specific Question:   If indicated for the ordered procedure, I authorize the administration of oral contrast media per Radiology protocol    Answer:   Yes   All questions were answered. The patient knows to call the clinic with any problems, questions or concerns. No barriers to learning was detected. The total time spent in the appointment was 20 minutes.     Malachy Mood, MD 07/30/2023

## 2024-01-19 ENCOUNTER — Ambulatory Visit (HOSPITAL_COMMUNITY)
Admission: RE | Admit: 2024-01-19 | Discharge: 2024-01-19 | Disposition: A | Discharge status: Home / Self Care | Source: Ambulatory Visit | Attending: Hematology | Admitting: Hematology

## 2024-01-19 DIAGNOSIS — C49A2 Gastrointestinal stromal tumor of stomach: Secondary | ICD-10-CM | POA: Insufficient documentation

## 2024-01-19 MED ORDER — IOHEXOL 300 MG/ML  SOLN
100.0000 mL | Freq: Once | INTRAMUSCULAR | Status: AC | PRN
  Administered 2024-01-19: 100 mL via INTRAVENOUS

## 2024-01-19 MED ORDER — SODIUM CHLORIDE (PF) 0.9 % IJ SOLN
INTRAMUSCULAR | Status: AC
  Filled 2024-01-19: qty 50

## 2024-01-27 NOTE — Assessment & Plan Note (Signed)
 pT2N0M0 stage IA -incidental findings during work up his LE vascular issues on CT 11/27/20  -endoscopy with Dr Brice Campi on 01/10/21, biopsy showed neoplastic cells consistent with GIST.  -CT chest 01/22/21 was negative. -s/p surgical resection with Dr Leighton Punches on 02/04/21, path showed 3.5cm GIST tumor, clear margins and mitotic rate of 6. KIT and PDGFRa mutations not detected.  -he has intermediate risk for recurrence, due to the negative kit and PDGFR mutation, adjuvant imatinib was not recommended. -restaging CT AP on 01/19/2024 showed NED.  Will continue surveillance.  Plan to continue screening CT scan annually until 5 years.

## 2024-01-28 ENCOUNTER — Encounter: Payer: Self-pay | Admitting: Hematology

## 2024-01-28 ENCOUNTER — Inpatient Hospital Stay: Payer: Medicare Other | Attending: Hematology

## 2024-01-28 ENCOUNTER — Inpatient Hospital Stay (HOSPITAL_BASED_OUTPATIENT_CLINIC_OR_DEPARTMENT_OTHER): Payer: Medicare Other | Admitting: Hematology

## 2024-01-28 VITALS — BP 128/88 | HR 117 | Temp 98.3°F | Resp 17 | Wt 127.9 lb

## 2024-01-28 DIAGNOSIS — K219 Gastro-esophageal reflux disease without esophagitis: Secondary | ICD-10-CM | POA: Diagnosis not present

## 2024-01-28 DIAGNOSIS — K7689 Other specified diseases of liver: Secondary | ICD-10-CM | POA: Insufficient documentation

## 2024-01-28 DIAGNOSIS — C49A2 Gastrointestinal stromal tumor of stomach: Secondary | ICD-10-CM | POA: Diagnosis not present

## 2024-01-28 DIAGNOSIS — K8020 Calculus of gallbladder without cholecystitis without obstruction: Secondary | ICD-10-CM | POA: Diagnosis not present

## 2024-01-28 DIAGNOSIS — Z08 Encounter for follow-up examination after completed treatment for malignant neoplasm: Secondary | ICD-10-CM | POA: Insufficient documentation

## 2024-01-28 DIAGNOSIS — Z85028 Personal history of other malignant neoplasm of stomach: Secondary | ICD-10-CM | POA: Diagnosis present

## 2024-01-28 LAB — CMP (CANCER CENTER ONLY)
ALT: 104 U/L — ABNORMAL HIGH (ref 0–44)
AST: 34 U/L (ref 15–41)
Albumin: 4.6 g/dL (ref 3.5–5.0)
Alkaline Phosphatase: 149 U/L — ABNORMAL HIGH (ref 38–126)
Anion gap: 5 (ref 5–15)
BUN: 10 mg/dL (ref 6–20)
CO2: 33 mmol/L — ABNORMAL HIGH (ref 22–32)
Calcium: 9.4 mg/dL (ref 8.9–10.3)
Chloride: 103 mmol/L (ref 98–111)
Creatinine: 0.76 mg/dL (ref 0.61–1.24)
GFR, Estimated: >60 mL/min (ref >60)
Glucose, Bld: 88 mg/dL (ref 70–99)
Potassium: 3.6 mmol/L (ref 3.5–5.1)
Sodium: 141 mmol/L (ref 135–145)
Total Bilirubin: 0.4 mg/dL (ref 0.0–1.2)
Total Protein: 7.7 g/dL (ref 6.5–8.1)

## 2024-01-28 LAB — CBC WITH DIFFERENTIAL (CANCER CENTER ONLY)
Abs Immature Granulocytes: 0.01 10*3/uL (ref 0.00–0.07)
Basophils Absolute: 0.1 10*3/uL (ref 0.0–0.1)
Basophils Relative: 1 %
Eosinophils Absolute: 0.4 10*3/uL (ref 0.0–0.5)
Eosinophils Relative: 7 %
HCT: 40.6 % (ref 39.0–52.0)
Hemoglobin: 13.8 g/dL (ref 13.0–17.0)
Immature Granulocytes: 0 %
Lymphocytes Relative: 33 %
Lymphs Abs: 1.7 10*3/uL (ref 0.7–4.0)
MCH: 30.1 pg (ref 26.0–34.0)
MCHC: 34 g/dL (ref 30.0–36.0)
MCV: 88.6 fL (ref 80.0–100.0)
Monocytes Absolute: 0.4 10*3/uL (ref 0.1–1.0)
Monocytes Relative: 9 %
Neutro Abs: 2.6 10*3/uL (ref 1.7–7.7)
Neutrophils Relative %: 50 %
Platelet Count: 242 10*3/uL (ref 150–400)
RBC: 4.58 MIL/uL (ref 4.22–5.81)
RDW: 12.7 % (ref 11.5–15.5)
WBC Count: 5.1 10*3/uL (ref 4.0–10.5)
nRBC: 0 % (ref 0.0–0.2)

## 2024-01-28 NOTE — Progress Notes (Signed)
 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  -CT chest 01/22/21 was negative. -s/p surgical resection with Dr Freida Busman on 02/04/21, path showed 3.5cm GIST tumor, clear margins and mitotic rate of 6. KIT and PDGFRa mutations not detected.  -he has intermediate risk for recurrence, due to the negative kit and PDGFR mutation, adjuvant imatinib was not recommended. -restaging CT [facility] on 01/19/2024 showed NED.  Will continue surveillance.  Plan to continue screening CT scan annually until 5 years.    Assessment & Plan GIST Surveillance Three years post-GIST diagnosis with no recurrence on recent CT scan. Blood counts, renal, and hepatic functions are normal. Surveillance includes annual scans and biannual follow-ups. - Continue annual CT scans until 5 years   - Schedule follow-up appointments every six months  Gastroesophageal Reflux Disease (GERD) Recent onset of gas and dull pain likely related to GERD, occurring during the daytime. Prilosec, Tums, Mylanta, and Ginger Ale provide some relief. Absence of nausea or melena reduces likelihood of upper GI bleeding or ulcers. If symptoms persist beyond a month, endoscopy by Dr. Rosana Fret may be necessary. Prilosec and Pepcid can be  used concurrently for symptom management. - Continue Prilosec twice daily - Use Pepcid as needed for additional relief - Monitor symptoms for another month - If symptoms persist, contact Dr. Rosana Fret for possible endoscopy  Cholelithiasis CT scan shows a gallbladder stone consistent with previous findings. No symptoms indicating complications.  Liver Cyst CT scan reveals a small hepatic cyst unchanged from the previous year. Likely benign, requiring no intervention.  Plan -lab and CT scan reviewed, NET Monitor for worsening symptoms or new abdominal pain.  -lab and follow-up scheduled in six months unless symptoms necessitate earlier contact.      SUMMARY OF ONCOLOGIC HISTORY: Oncology History Overview Note  Cancer Staging Gastrointestinal stromal tumor (GIST) of stomach (HCC) Staging form: Gastrointestinal Stromal Tumor - Gastric and Omental GIST, AJCC 8th Edition - Clinical stage from 01/10/2021: Stage IA (cT2, cN0, cM0, Mitotic Rate: Low) - Signed by Malachy Mood, MD on 01/23/2021 Stage prefix: Initial diagnosis Histologic grade (G): Low grade Histologic grading system: 2 grade system    Gastrointestinal stromal tumor (GIST) of stomach (HCC)  11/27/2020 Imaging   CT Angio 11/27/20  IMPRESSION: 1. No acute arterial abnormality detected. However, evaluation below the knees was somewhat limited by contrast timing. 2. Cholelithiasis without acute inflammation. 3. Distended urinary bladder with some mild bladder wall thickening. Correlate with urinalysis. Findings are suggestive of a neurogenic bladder. 4. Exophytic masslike area arising from the greater curvature of the gastric body. This is concerning for a malignancy such as a GIST. Outpatient GI follow-up is recommended.   12/04/2020 Procedure   Upper Endoscopy by Dr Marletta Lor 12/04/20  IMPRESSION - Rule out malignancy, gastric tumor on the greater curvature of the stomach. Biopsied. - Gastritis.  Biopsied. - Normal duodenal  bulb, first portion of the duodenum and second portion of the duodenum.  FINAL MICROSCOPIC DIAGNOSIS: 12/04/20 A. STOMACH, MASS, BIOPSY:  - Gastric oxyntic mucosa with mild chronic gastritis  - Negative for intestinal metaplasia, dysplasia or malignancy  - No submucosa is present for evaluation   B. STOMACH, ANTRUM AND BODY, BIOPSY:  - Gastric antral and oxyntic mucosa with mild chronic gastritis  - Gastric oxyntic mucosa with parietal cell hyperplasia as can be seen  in hypergastrinemic states such as PPI therapy.  - Warthin Starry stain is negative for Helicobacter pylori    01/10/2021 Procedure   Upper EUS by Dr Brice Campi 01/10/21 EGD Impression: - No gross lesions in esophagus. Z-line regular, 36 cm from the incisors. - Gastritis. Biopsied. - Rule out malignancy, gastric subepithelial lesion on the greater curvature of the stomach was noted. - No gross lesions in the duodenal bulb, in the first portion of the duodenum and in the second portion of the duodenum. EUS Impression: - An intramural (subepithelial) lesion was found in the greater curve of the stomach. The lesion appeared to originate from within the muscularis propria (Layer 4). Cytology results are pending. However, the endosonographic appearance is highly suspicious for a stromal cell (smooth muscle) neoplasm. Fine needle biopsy performed. - No malignant-appearing lymph nodes were visualized in the celiac region (level 20), perigastric region and peripancreatic region.    FINAL MICROSCOPIC DIAGNOSIS: 01/10/21 A. STOMACH, BIOPSY:  - Gastric antral and oxyntic mucosa with slight chronic inflammation.  - Warthin-Starry negative for Helicobacter pylori.  - No intestinal metaplasia, dysplasia, GIST or carcinoma.     01/10/2021 Initial Biopsy   Initial Biopsy 01/10/21 FINAL MICROSCOPIC DIAGNOSIS:  A. STOMACH, FINE NEEDLE ASPIRATION:  - Neoplastic cells consistent with gastrointestinal stromal tumor  (GIST).  - See  comment.   COMMENT:  The neoplastic cells have epithelioid morphology.  Immunohistochemistry  shows strong positivity with CD117 (c-kit) and CD34.  The neoplastic  cells are negative with cytokeratin AE1/AE3, CD56, chromogranin and  synaptophysin.  Immunohistochemistry with Ki-67 shows a low  proliferation rate.  The findings are consistent with gastrointestinal  stromal tumor (GIST) with epithelioid features.    01/10/2021 Cancer Staging   Staging form: Gastrointestinal Stromal Tumor - Gastric and Omental GIST, AJCC 8th Edition - Clinical stage from 01/10/2021: Stage IA (cT2, cN0, cM0, Mitotic Rate: Low) - Signed by Sonja [place], MD on 01/23/2021 Stage prefix: Initial diagnosis Histologic grade (G): Low grade Histologic grading system: 2 grade system   01/22/2021 Imaging   CT Chest  IMPRESSION: 1. No pulmonary metastasis. 2. No mediastinal nodal metastasis. 3. Partially exophytic submucosal lesion extending from the greater curvature measures up to 3.5 cm. No upper abdominal adenopathy.     01/23/2021 Initial Diagnosis   Gastrointestinal stromal tumor (GIST) of stomach (HCC)   02/04/2021 Surgery   LAPAROSCOPIC PARTIAL GASTRECTOMY by Dr Leighton Punches   02/04/2021 Pathology Results   FINAL MICROSCOPIC DIAGNOSIS:   A. STOMACH MASS, PARTIAL GASTRECTOMY:  -  Gastrointestinal stromal tumor, 3.5 cm  -  Tumor extends to the cauterized resection margins  -  See oncology table and comment below   B. GASTRIC MARGIN, EXCISION:  -  No residual tumor identified     PDGFRa mutations no detected  KIT (c-KIT) Mutations:  NOT DECTECTED    01/20/2022 Imaging   EXAM: CT ABDOMEN AND PELVIS WITH CONTRAST  IMPRESSION: 1. Postsurgical change of gastric mass removal without evidence of local recurrence. 2. No evidence  of metastatic disease within the abdomen or pelvis. 3. Cholelithiasis without evidence of acute inflammation. 4. Distended urinary bladder with some irregular urinary bladder wall  thickening, similar to prior possibly reflecting sequela of neurogenic bladder. Consider further evaluation with cystoscopy if clinically indicated. 5. 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 6. Tubular oval fluid density focus which appears tethered to the umbilicus with a linear band extending to the urinary bladder dome measuring approximally 2.3 x 1.3 x 1.2 cm most consistent with a urachal cyst. 7. Focal subcutaneous stranding with soft tissue thickening posterior to the left greater than right femoral trochanters may reflect developing pressure wounds.      Discussed the use of AI scribe software for clinical note transcription with the patient, who gave verbal consent to proceed.  History of Present Illness Darryl Moran, a 48 year old with a history of cancer, presents with recent symptoms of gas and dull chest pain, which he describes as being located in the upper chest area. The pain occurs during the daytime and has been ongoing for approximately four to five days, with two particularly severe episodes. He has been managing his symptoms with Prilosec twice daily, as well as Tums, Mylanta, and ginger ale as needed. He reports that these treatments seem to help alleviate his symptoms. He denies experiencing any nausea. He also has a history of shunt replacements.     All other systems were reviewed with the patient and are negative.  MEDICAL HISTORY:  Past Medical History:  Diagnosis Date   GERD (gastroesophageal reflux disease)    GERD (gastroesophageal reflux disease)    Seizure (HCC)    unknown etiology and on keppra now. last seizure was 5 years ago as of 11/30/2020.   Spina bifida [facility])     SURGICAL HISTORY: Past Surgical History:  Procedure Laterality Date   BIOPSY  12/04/2020   Procedure: BIOPSY;  Surgeon: Lanelle Bal, DO;  [facility];  Service: Endoscopy;;    BIOPSY  01/10/2021   Procedure: BIOPSY;  Surgeon: Lemar Lofty., MD;  Location: [facility] ENDOSCOPY;  Service: Gastroenterology;;   ESOPHAGOGASTRODUODENOSCOPY (EGD) WITH PROPOFOL N/A 12/04/2020   Procedure: ESOPHAGOGASTRODUODENOSCOPY (EGD) WITH PROPOFOL;  Surgeon: Lanelle Bal, DO;  Location: [facility];  Service: Endoscopy;  Laterality: N/A;  9:00am   ESOPHAGOGASTRODUODENOSCOPY (EGD) WITH PROPOFOL N/A 01/10/2021   Procedure: ESOPHAGOGASTRODUODENOSCOPY (EGD) WITH PROPOFOL;  Surgeon: Meridee Score Netty Starring., MD;  Location: [facility] ENDOSCOPY;  Service: Gastroenterology;  Laterality: N/A;   EUS N/A 01/10/2021   Procedure: UPPER ENDOSCOPIC ULTRASOUND (EUS) RADIAL;  Surgeon: Lemar Lofty., MD;  Location: [facility] ENDOSCOPY;  Service: Gastroenterology;  Laterality: N/A;   FINE NEEDLE ASPIRATION  01/10/2021   Procedure: FINE NEEDLE ASPIRATION (FNA) LINEAR;  Surgeon: Lemar Lofty., MD;  Location: [facility] ENDOSCOPY;  Service: Gastroenterology;;   FRACTURE SURGERY Left    metal plate placed in leg at [facility] for stabilization per mother 12/30/20   LAPAROSCOPIC PARTIAL GASTRECTOMY N/A 02/04/2021   Procedure: LAPAROSCOPIC PARTIAL GASTRECTOMY;  Surgeon: Fritzi Mandes, MD;  Location: [facility] OR;  Service: General;  Laterality: N/A;  2 HRS   SHUNT REPLACEMENT     SHUNT REVISION     SPINE SURGERY      I have reviewed the social history and family history with the patient and they are unchanged from previous note.  ALLERGIES:  is allergic to latex and amoxicillin.  MEDICATIONS:  Current Outpatient Medications  Medication Sig Dispense Refill   acetaminophen (TYLENOL) 500 MG tablet Take 2 tablets (1,000 mg total) by mouth every 8 (eight) hours as needed for mild pain. 30 tablet 0   Calcium Carbonate-Vitamin D 600-200 MG-UNIT TABS Take 1 tablet by mouth in the morning.     Cholecalciferol (VITAMIN D3) 50 MCG (2000 UT) TABS Take 2,000 Units by mouth in the morning.     imipramine (TOFRANIL) 50  MG tablet Take 50 mg by mouth at bedtime.     levETIRAcetam (KEPPRA) 500 MG tablet Take 250-500 mg by mouth See admin instructions. Take 0.5 tablet (250 mg) by mouth in the morning & take 1 tablet (500 mg) by mouth at night.     Multiple Vitamin (MULTIVITAMIN WITH MINERALS) TABS tablet Take 1 tablet by mouth in the morning.     omeprazole (PRILOSEC) 20 MG capsule TAKE 1 CAPSULE BY MOUTH TWICE DAILY BEFORE A MEALS. 60 capsule 11   oxybutynin (DITROPAN XL) 15 MG 24 hr tablet Take 15 mg by mouth in the morning.     oxybutynin (DITROPAN) 5 MG tablet Take 5 mg by mouth at bedtime.     oxyCODONE (OXY IR/ROXICODONE) 5 MG immediate release tablet Take 1 tablet (5 mg total) by mouth every 6 (six) hours as needed for severe pain. 15 tablet 0   Probiotic Product (PROBIOTIC ADVANCED PO) Take 1 capsule by mouth in the morning.     sertraline (ZOLOFT) 100 MG tablet Take 100 mg by mouth at bedtime.     vitamin C (ASCORBIC ACID) 500 MG tablet Take 500 mg by mouth in the morning.     No current facility-administered medications for this visit.    PHYSICAL EXAMINATION: ECOG PERFORMANCE STATUS: 1 - Symptomatic but completely ambulatory  Vitals:   01/28/24 1334  BP: 128/88  Pulse: (!) 117  Resp: 17  Temp: 98.3 F (36.8 C)   Wt Readings from Last 3 Encounters:  01/28/24 127 lb 14.4 oz (58 kg)  07/30/23 129 lb 14.4 oz (58.9 kg)  01/28/23 132 lb 6.4 oz (60.1 kg)     GENERAL:alert, no distress and comfortable SKIN: skin color, texture, turgor are normal, no rashes or significant lesions EYES: normal, Conjunctiva are pink and non-injected, sclera clear NECK: supple, thyroid normal size, non-tender, without nodularity LYMPH:  no palpable lymphadenopathy in the cervical, axillary  LUNGS: clear to auscultation and percussion with normal breathing effort HEART: regular rate & rhythm and no murmurs and no lower extremity edema ABDOMEN:abdomen soft, non-tender and normal bowel sounds Musculoskeletal:no  cyanosis of digits and no clubbing  NEURO: alert & oriented x 3 with fluent speech, no focal motor/sensory deficits  Physical Exam   LABORATORY DATA:  I have reviewed the data as listed    Latest Ref Rng & Units 01/28/2024   12:58 PM 07/30/2023   12:13 PM 01/26/2023   11:15 AM  CBC  WBC 4.0 - 10.5 K/uL 5.1  5.0  5.2   Hemoglobin 13.0 - 17.0 g/dL 16.1  09.6  04.5   Hematocrit 39.0 - 52.0 % 40.6  41.3  42.8   Platelets 150 - 400 K/uL 242  185  234         Latest Ref Rng & Units 01/28/2024   12:58 PM 07/30/2023   12:13 PM 01/26/2023   11:15 AM  CMP  Glucose 70 - 99 mg/dL 88  409  89   BUN 6 - 20 mg/dL 10  8  13    Creatinine  0.61 - 1.24 mg/dL 2.95  6.21  3.08   Sodium 135 - 145 mmol/L 141  141  141   Potassium 3.5 - 5.1 mmol/L 3.6  3.9  3.9   Chloride 98 - 111 mmol/L 103  102  102   CO2 22 - 32 mmol/L 33  32  32   Calcium 8.9 - 10.3 mg/dL 9.4  9.5  9.6   Total Protein 6.5 - 8.1 g/dL 7.7  7.5  7.9   Total Bilirubin 0.0 - 1.2 mg/dL 0.4  0.3  0.4   Alkaline Phos 38 - 126 U/L 149  99  101   AST 15 - 41 U/L 34  20  23   ALT 0 - 44 U/L 104  19  21       RADIOGRAPHIC STUDIES: I have personally reviewed the radiological images as listed and agreed with the findings in the report. No results found.    No orders of the defined types were placed in this encounter.  All questions were answered. The patient knows to call the clinic with any problems, questions or concerns. No barriers to learning was detected. The total time spent in the appointment was 25 minutes.     Sonja [place], MD 01/28/2024

## 2024-07-29 ENCOUNTER — Other Ambulatory Visit: Payer: Self-pay

## 2024-07-29 DIAGNOSIS — C49A2 Gastrointestinal stromal tumor of stomach: Secondary | ICD-10-CM

## 2024-08-01 ENCOUNTER — Inpatient Hospital Stay: Attending: Hematology

## 2024-08-01 ENCOUNTER — Inpatient Hospital Stay: Admitting: Hematology

## 2024-08-01 VITALS — BP 122/72 | HR 115 | Temp 97.3°F | Resp 19 | Wt 123.9 lb

## 2024-08-01 DIAGNOSIS — C49A2 Gastrointestinal stromal tumor of stomach: Secondary | ICD-10-CM | POA: Insufficient documentation

## 2024-08-01 LAB — CMP (CANCER CENTER ONLY)
ALT: 18 U/L (ref 0–44)
AST: 20 U/L (ref 15–41)
Albumin: 4.3 g/dL (ref 3.5–5.0)
Alkaline Phosphatase: 96 U/L (ref 38–126)
Anion gap: 7 (ref 5–15)
BUN: 13 mg/dL (ref 6–20)
CO2: 32 mmol/L (ref 22–32)
Calcium: 9.8 mg/dL (ref 8.9–10.3)
Chloride: 101 mmol/L (ref 98–111)
Creatinine: 0.8 mg/dL (ref 0.61–1.24)
GFR, Estimated: >60 mL/min (ref >60)
Glucose, Bld: 82 mg/dL (ref 70–99)
Potassium: 4 mmol/L (ref 3.5–5.1)
Sodium: 140 mmol/L (ref 135–145)
Total Bilirubin: 0.3 mg/dL (ref 0.0–1.2)
Total Protein: 7.3 g/dL (ref 6.5–8.1)

## 2024-08-01 LAB — CBC WITH DIFFERENTIAL (CANCER CENTER ONLY)
Abs Immature Granulocytes: 0.02 K/uL (ref 0.00–0.07)
Basophils Absolute: 0 K/uL (ref 0.0–0.1)
Basophils Relative: 0 %
Eosinophils Absolute: 0.2 K/uL (ref 0.0–0.5)
Eosinophils Relative: 4 %
HCT: 41.9 % (ref 39.0–52.0)
Hemoglobin: 14 g/dL (ref 13.0–17.0)
Immature Granulocytes: 0 %
Lymphocytes Relative: 24 %
Lymphs Abs: 1.2 K/uL (ref 0.7–4.0)
MCH: 29.6 pg (ref 26.0–34.0)
MCHC: 33.4 g/dL (ref 30.0–36.0)
MCV: 88.6 fL (ref 80.0–100.0)
Monocytes Absolute: 0.4 K/uL (ref 0.1–1.0)
Monocytes Relative: 7 %
Neutro Abs: 3.3 K/uL (ref 1.7–7.7)
Neutrophils Relative %: 65 %
Platelet Count: 232 K/uL (ref 150–400)
RBC: 4.73 MIL/uL (ref 4.22–5.81)
RDW: 12.9 % (ref 11.5–15.5)
WBC Count: 5.1 K/uL (ref 4.0–10.5)
nRBC: 0 % (ref 0.0–0.2)

## 2024-08-01 NOTE — Progress Notes (Signed)
 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  -CT chest 01/22/21 was negative. -s/p surgical resection with Dr Dasie on 02/04/21, path showed 3.5cm GIST tumor, clear margins and mitotic rate of 6. KIT and PDGFRa mutations not detected.  -he has intermediate risk for recurrence, due to the negative kit and PDGFR mutation, adjuvant imatinib was not recommended. -restaging CT [facility] on 01/19/2024 showed NED.  Will continue surveillance.  Plan to continue screening CT scan annually until 5 years.  Assessment & Plan Gastrointestinal stromal tumor of the stomach Currently under surveillance with no significant changes or symptoms in the past six months. Weight is slightly low but well-managed. Blood counts are normal. Awaiting kidney and liver function tests. B12 levels were previously normal, with future checks planned due to potential post-surgical deficiency. - Continue surveillance with CT scan in six months, around mid-April. - Schedule lab work a week before the CT scan, around April 14th. - Ensure lab work and CT scan are scheduled on the same day, with lab work first. - Monitor for symptoms such as stomach pain, bloating, or changes in eating habits and report if he occurs. - Check B12 levels in future visits.  Plan - He is  clinically doing very well, exam was unremarkable - Follow-up in 6 months with lab and surveillance CT scan 1 week before   SUMMARY OF ONCOLOGIC HISTORY: Oncology History Overview Note  Cancer Staging Gastrointestinal stromal tumor (GIST) of stomach (HCC) Staging form: Gastrointestinal Stromal Tumor - Gastric and Omental GIST, AJCC 8th Edition - Clinical stage from 01/10/2021: Stage IA (cT2, cN0, cM0, Mitotic Rate: Low) - Signed by Lanny Callander, MD on 01/23/2021 Stage prefix: Initial diagnosis Histologic grade (G): Low grade Histologic grading system: 2 grade system    Gastrointestinal stromal tumor (GIST) of stomach (HCC)  11/27/2020 Imaging   CT Angio 11/27/20  IMPRESSION: 1. No acute arterial abnormality detected. However, evaluation below the knees was somewhat limited by contrast timing. 2. Cholelithiasis without acute inflammation. 3. Distended urinary bladder with some mild bladder wall thickening. Correlate with urinalysis. Findings are suggestive of a neurogenic bladder. 4. Exophytic masslike area arising from the greater curvature of the gastric body. This is concerning for a malignancy such as a GIST. Outpatient GI follow-up is recommended.   12/04/2020 Procedure   Upper Endoscopy by Dr Cindie 12/04/20  IMPRESSION - Rule out malignancy, gastric tumor on the greater curvature of the stomach. Biopsied. - Gastritis. Biopsied. - Normal duodenal bulb, first portion of the duodenum and second portion of the duodenum.  FINAL MICROSCOPIC DIAGNOSIS: 12/04/20 A. STOMACH, MASS, BIOPSY:  - Gastric oxyntic mucosa with mild chronic gastritis  - Negative for intestinal metaplasia, dysplasia or malignancy  - No submucosa is present for evaluation   B. STOMACH, ANTRUM AND BODY, BIOPSY:  - Gastric antral and oxyntic mucosa with mild chronic gastritis  - Gastric oxyntic mucosa  with parietal cell hyperplasia as can be seen  in hypergastrinemic states such as PPI therapy.  - Warthin Starry  stain is negative for Helicobacter pylori    01/10/2021 Procedure   Upper EUS by Dr Wilhelmenia 01/10/21 EGD Impression: - No gross lesions in esophagus. Z-line regular, 36 cm from the incisors. - Gastritis. Biopsied. - Rule out malignancy, gastric subepithelial lesion on the greater curvature of the stomach was noted. - No gross lesions in the duodenal bulb, in the first portion of the duodenum and in the second portion of the duodenum. EUS Impression: - An intramural (subepithelial) lesion was found in the greater curve of the stomach. The lesion appeared to originate from within the muscularis propria (Layer 4). Cytology results are pending. However, the endosonographic appearance is highly suspicious for a stromal cell (smooth muscle) neoplasm. Fine needle biopsy performed. - No malignant-appearing lymph nodes were visualized in the celiac region (level 20), perigastric region and peripancreatic region.    FINAL MICROSCOPIC DIAGNOSIS: 01/10/21 A. STOMACH, BIOPSY:  - Gastric antral and oxyntic mucosa with slight chronic inflammation.  - Warthin-Starry negative for Helicobacter pylori.  - No intestinal metaplasia, dysplasia, GIST or carcinoma.     01/10/2021 Initial Biopsy   Initial Biopsy 01/10/21 FINAL MICROSCOPIC DIAGNOSIS:  A. STOMACH, FINE NEEDLE ASPIRATION:  - Neoplastic cells consistent with gastrointestinal stromal tumor  (GIST).  - See comment.   COMMENT:  The neoplastic cells have epithelioid morphology.  Immunohistochemistry  shows strong positivity with CD117 (c-kit) and CD34.  The neoplastic  cells are negative with cytokeratin AE1/AE3, CD56, chromogranin and  synaptophysin.  Immunohistochemistry with Ki-67 shows a low  proliferation rate.  The findings are consistent with gastrointestinal  stromal tumor (GIST) with epithelioid features.    01/10/2021 Cancer Staging   Staging form: Gastrointestinal Stromal Tumor - Gastric and Omental GIST, AJCC 8th Edition -  Clinical stage from 01/10/2021: Stage IA (cT2, cN0, cM0, Mitotic Rate: Low) - Signed by Lanny Callander, MD on 01/23/2021 Stage prefix: Initial diagnosis Histologic grade (G): Low grade Histologic grading system: 2 grade system   01/22/2021 Imaging   CT Chest  IMPRESSION: 1. No pulmonary metastasis. 2. No mediastinal nodal metastasis. 3. Partially exophytic submucosal lesion extending from the greater curvature measures up to 3.5 cm. No upper abdominal adenopathy.     01/23/2021 Initial Diagnosis   Gastrointestinal stromal tumor (GIST) of stomach (HCC)   02/04/2021 Surgery   LAPAROSCOPIC PARTIAL GASTRECTOMY by Dr Dasie   02/04/2021 Pathology Results   FINAL MICROSCOPIC DIAGNOSIS:   A. STOMACH MASS, PARTIAL GASTRECTOMY:  -  Gastrointestinal stromal tumor, 3.5 cm  -  Tumor extends to the cauterized resection margins  -  See oncology table and comment below   B. GASTRIC MARGIN, EXCISION:  -  No residual tumor identified     PDGFRa mutations no detected  KIT (c-KIT) Mutations:  NOT DECTECTED    01/20/2022 Imaging   EXAM: CT ABDOMEN AND PELVIS WITH CONTRAST  IMPRESSION: 1. Postsurgical change of gastric mass removal without evidence of local recurrence. 2. No evidence of metastatic disease within the abdomen or pelvis. 3. Cholelithiasis without evidence of acute inflammation. 4. Distended urinary bladder with some irregular urinary bladder wall thickening, similar to prior possibly reflecting sequela of neurogenic bladder. Consider further evaluation with cystoscopy if clinically indicated. 5. 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 6. Tubular oval fluid density focus which appears tethered to the umbilicus with  a linear band extending to the urinary bladder dome measuring approximally 2.3 x 1.3 x 1.2 cm most consistent with a urachal cyst. 7. Focal subcutaneous stranding with soft  tissue thickening posterior to the left greater than right femoral trochanters may reflect developing pressure wounds.      Discussed the use of AI scribe software for clinical note transcription with the patient, who gave verbal consent to proceed.  History of Present Illness Darryl Moran is a 48 year old male with a history of gastric adenocarcinoma who presents for follow-up.  He is three and a half years post-diagnosis of gastric adenocarcinoma, diagnosed in March 2022. He has undergone gastric surgery and has had five CT scans since his diagnosis. He is scheduled for another CT scan in six months. He reports no new issues with his stomach and denies bloating or stomach upset after eating. His weight is slightly lower than before, but there has been no major change since his last visit over a year ago.  His current medications include ribatric, omeprazole , Sirturo, oxybutynin , Keppra , imipramine  (Tofranil ), calcium, vitamin D , and a multivitamin. He is not taking oxycodone  or Wellbutrin (bupropion).     All other systems were reviewed with the patient and are negative.  MEDICAL HISTORY:  Past Medical History:  Diagnosis Date   GERD (gastroesophageal reflux disease)    GERD (gastroesophageal reflux disease)    Seizure (HCC)    unknown etiology and on keppra  now. last seizure was 5 years ago as of 11/30/2020.   Spina bifida [facility])     SURGICAL HISTORY: Past Surgical History:  Procedure Laterality Date   BIOPSY  12/04/2020   Procedure: BIOPSY;  Surgeon: Cindie Carlin POUR, DO;  Location[facility];  Service: Endoscopy;;   BIOPSY  01/10/2021   Procedure: BIOPSY;  Surgeon: Wilhelmenia Aloha Raddle., MD;  Location: [facility] ENDOSCOPY;  Service: Gastroenterology;;   ESOPHAGOGASTRODUODENOSCOPY (EGD) WITH PROPOFOL  N/A 12/04/2020   Procedure: ESOPHAGOGASTRODUODENOSCOPY (EGD) WITH PROPOFOL ;  Surgeon: Cindie Carlin POUR, DO;  Location: [facility];  Service: Endoscopy;  Laterality: N/A;  9:00am    ESOPHAGOGASTRODUODENOSCOPY (EGD) WITH PROPOFOL  N/A 01/10/2021   Procedure: ESOPHAGOGASTRODUODENOSCOPY (EGD) WITH PROPOFOL ;  Surgeon: Wilhelmenia Aloha Raddle., MD;  Location: [facility] ENDOSCOPY;  Service: Gastroenterology;  Laterality: N/A;   EUS N/A 01/10/2021   Procedure: UPPER ENDOSCOPIC ULTRASOUND (EUS) RADIAL;  Surgeon: Wilhelmenia Aloha Raddle., MD;  Location: [facility] ENDOSCOPY;  Service: Gastroenterology;  Laterality: N/A;   FINE NEEDLE ASPIRATION  01/10/2021   Procedure: FINE NEEDLE ASPIRATION (FNA) LINEAR;  Surgeon: Wilhelmenia Aloha Raddle., MD;  Location: [facility] ENDOSCOPY;  Service: Gastroenterology;;   FRACTURE SURGERY Left    metal plate [facility] leg at [facility] for stabilization per mother 12/30/20   LAPAROSCOPIC PARTIAL GASTRECTOMY N/A 02/04/2021   Procedure: LAPAROSCOPIC PARTIAL GASTRECTOMY;  Surgeon: Dasie Leonor CROME, MD;  Location: [facility] OR;  Service: General;  Laterality: N/A;  2 HRS   SHUNT REPLACEMENT     SHUNT REVISION     SPINE SURGERY      I have reviewed the social history and family history with the patient and they are unchanged from previous note.  ALLERGIES:  is allergic to latex and amoxicillin.  MEDICATIONS:  Current Outpatient Medications  Medication Sig Dispense Refill   acetaminophen  (TYLENOL ) 500 MG tablet Take 2 tablets (1,000 mg total) by mouth every 8 (eight) hours as needed for mild pain. 30 tablet 0   buPROPion (WELLBUTRIN XL) 150 MG 24 hr tablet 1 [tablet_in_the_morning] by oral  route.     Calcium Carbonate-Vitamin D  600-200 MG-UNIT TABS Take 1 tablet by mouth in the morning.     Cholecalciferol  (VITAMIN D3) 50 MCG (2000 UT) TABS Take 2,000 Units by mouth in the morning.     imipramine  (TOFRANIL ) 50 MG tablet Take 50 mg by mouth at bedtime.     levETIRAcetam  (KEPPRA ) 500 MG tablet Take 250-500 mg by mouth See admin instructions. Take 0.5 tablet (250 mg) by mouth in the morning & take 1 tablet (500 mg) by mouth at night.     Multiple Vitamin (MULTIVITAMIN WITH MINERALS)  TABS tablet Take 1 tablet by mouth in the morning.     omeprazole  (PRILOSEC) 20 MG capsule TAKE 1 CAPSULE BY MOUTH TWICE DAILY BEFORE A MEALS. 60 capsule 11   oxybutynin  (DITROPAN  XL) 15 MG 24 hr tablet Take 15 mg by mouth in the morning.     oxybutynin  (DITROPAN ) 5 MG tablet Take 5 mg by mouth at bedtime.     Probiotic Product (PROBIOTIC ADVANCED PO) Take 1 capsule by mouth in the morning.     sertraline  (ZOLOFT ) 100 MG tablet Take 100 mg by mouth at bedtime.     vitamin C (ASCORBIC ACID) 500 MG tablet Take 500 mg by mouth in the morning.     No current facility-administered medications for this visit.    PHYSICAL EXAMINATION: ECOG PERFORMANCE STATUS: 1 - Symptomatic but completely ambulatory  Vitals:   08/01/24 1411  BP: 122/72  Pulse: (!) 115  Resp: 19  Temp: (!) 97.3 F (36.3 C)  SpO2: 99%   Wt Readings from Last 3 Encounters:  08/01/24 123 lb 14.4 oz (56.2 kg)  01/28/24 127 lb 14.4 oz (58 kg)  07/30/23 129 lb 14.4 oz (58.9 kg)     GENERAL:alert, no distress and comfortable SKIN: skin color, texture, turgor are normal, no rashes or significant lesions EYES: normal, Conjunctiva are pink and non-injected, sclera clear NECK: supple, thyroid normal size, non-tender, without nodularity LYMPH:  no palpable lymphadenopathy in the cervical, axillary  LUNGS: clear to auscultation and percussion with normal breathing effort HEART: regular rate & rhythm and no murmurs and no lower extremity edema ABDOMEN:abdomen soft, non-tender and normal bowel sounds Musculoskeletal:no cyanosis of digits and no clubbing  NEURO: alert & oriented x 3 with fluent speech, no focal motor/sensory deficits  Physical Exam MEASUREMENTS: Weight- 127.  LABORATORY DATA:  I have reviewed the data as listed    Latest Ref Rng & Units 08/01/2024    1:46 PM 01/28/2024   12:58 PM 07/30/2023   12:13 PM  CBC  WBC 4.0 - 10.5 K/uL 5.1  5.1  5.0   Hemoglobin 13.0 - 17.0 g/dL 85.9  86.1  85.9   Hematocrit 39.0  - 52.0 % 41.9  40.6  41.3   Platelets 150 - 400 K/uL 232  242  185         Latest Ref Rng & Units 08/01/2024    1:46 PM 01/28/2024   12:58 PM 07/30/2023   12:13 PM  CMP  Glucose 70 - 99 mg/dL 82  88  895   BUN 6 - 20 mg/dL 13  10  8    Creatinine 0.61 - 1.24 mg/dL 9.19  9.23  9.28   Sodium 135 - 145 mmol/L 140  141  141   Potassium 3.5 - 5.1 mmol/L 4.0  3.6  3.9   Chloride 98 - 111 mmol/L 101  103  102   CO2 22 -  32 mmol/L 32  33  32   Calcium 8.9 - 10.3 mg/dL 9.8  9.4  9.5   Total Protein 6.5 - 8.1 g/dL 7.3  7.7  7.5   Total Bilirubin 0.0 - 1.2 mg/dL 0.3  0.4  0.3   Alkaline Phos 38 - 126 U/L 96  149  99   AST 15 - 41 U/L 20  34  20   ALT 0 - 44 U/L 18  104  19       RADIOGRAPHIC STUDIES: I have personally reviewed the radiological images as listed and agreed with the findings in the report. No results found.    Orders Placed This Encounter  Procedures   CT ABDOMEN PELVIS W CONTRAST    Standing Status:   Future    Expected Date:   01/24/2025    Expiration Date:   08/01/2025    If indicated for the ordered procedure, I authorize the administration of contrast media per Radiology protocol:   Yes    Does the patient have a contrast media/X-ray dye allergy?:   No    Preferred imaging location?:   [facility]    If indicated for the ordered procedure, I authorize the administration of oral contrast media per Radiology protocol:   Yes   Vitamin B12    Standing Status:   Standing    Number of Occurrences:   5    Expiration Date:   08/01/2025   All questions were answered. The patient knows to call the clinic with any problems, questions or concerns. No barriers to learning was detected. The total time spent in the appointment was 20 minutes, including review of chart and various tests results, discussions about plan of care and coordination of care plan     Onita Mattock, MD 08/01/2024

## 2024-08-01 NOTE — Assessment & Plan Note (Signed)
 pT2N0M0 stage IA -incidental findings during work up his LE vascular issues on CT 11/27/20  -endoscopy with Dr Brice Campi on 01/10/21, biopsy showed neoplastic cells consistent with GIST.  -CT chest 01/22/21 was negative. -s/p surgical resection with Dr Leighton Punches on 02/04/21, path showed 3.5cm GIST tumor, clear margins and mitotic rate of 6. KIT and PDGFRa mutations not detected.  -he has intermediate risk for recurrence, due to the negative kit and PDGFR mutation, adjuvant imatinib was not recommended. -restaging CT AP on 01/19/2024 showed NED.  Will continue surveillance.  Plan to continue screening CT scan annually until 5 years.

## 2024-08-03 ENCOUNTER — Other Ambulatory Visit: Payer: Self-pay

## 2025-01-24 ENCOUNTER — Inpatient Hospital Stay

## 2025-01-31 ENCOUNTER — Inpatient Hospital Stay: Admitting: Hematology
